# Patient Record
Sex: Female | Born: 1960
Health system: Southern US, Community
[De-identification: ages and names within clinical notes are randomized; demographics above are authoritative.]

## PROBLEM LIST (undated history)

## (undated) DIAGNOSIS — I10 Essential (primary) hypertension: Secondary | ICD-10-CM

## (undated) DIAGNOSIS — Z923 Personal history of irradiation: Secondary | ICD-10-CM

## (undated) DIAGNOSIS — G629 Polyneuropathy, unspecified: Secondary | ICD-10-CM

## (undated) DIAGNOSIS — Z9221 Personal history of antineoplastic chemotherapy: Secondary | ICD-10-CM

## (undated) DIAGNOSIS — C801 Malignant (primary) neoplasm, unspecified: Secondary | ICD-10-CM

## (undated) HISTORY — PX: KNEE SURGERY: SHX244

## (undated) HISTORY — PX: KNEE ARTHROSCOPY: SUR90

---

## 2009-11-04 ENCOUNTER — Emergency Department: Payer: Self-pay | Admitting: Emergency Medicine

## 2012-12-31 DIAGNOSIS — C801 Malignant (primary) neoplasm, unspecified: Secondary | ICD-10-CM

## 2012-12-31 HISTORY — DX: Malignant (primary) neoplasm, unspecified: C80.1

## 2016-02-20 ENCOUNTER — Emergency Department
Admission: EM | Admit: 2016-02-20 | Discharge: 2016-02-20 | Disposition: A | Discharge status: Home / Self Care | Payer: Self-pay | Attending: Emergency Medicine | Admitting: Emergency Medicine

## 2016-02-20 ENCOUNTER — Emergency Department: Payer: Self-pay

## 2016-02-20 ENCOUNTER — Encounter: Payer: Self-pay | Admitting: Emergency Medicine

## 2016-02-20 DIAGNOSIS — X501XXA Overexertion from prolonged static or awkward postures, initial encounter: Secondary | ICD-10-CM | POA: Insufficient documentation

## 2016-02-20 DIAGNOSIS — I10 Essential (primary) hypertension: Secondary | ICD-10-CM | POA: Insufficient documentation

## 2016-02-20 DIAGNOSIS — Y9289 Other specified places as the place of occurrence of the external cause: Secondary | ICD-10-CM | POA: Insufficient documentation

## 2016-02-20 DIAGNOSIS — Y998 Other external cause status: Secondary | ICD-10-CM | POA: Insufficient documentation

## 2016-02-20 DIAGNOSIS — S8991XA Unspecified injury of right lower leg, initial encounter: Secondary | ICD-10-CM | POA: Insufficient documentation

## 2016-02-20 DIAGNOSIS — Y9389 Activity, other specified: Secondary | ICD-10-CM | POA: Insufficient documentation

## 2016-02-20 HISTORY — DX: Essential (primary) hypertension: I10

## 2016-02-20 MED ORDER — HYDROCODONE-ACETAMINOPHEN 5-325 MG PO TABS: 1.0000 | ORAL_TABLET | ORAL | Status: DC | PRN

## 2016-02-20 MED ORDER — MELOXICAM 15 MG PO TABS: 15.0000 mg | ORAL_TABLET | Freq: Every day | ORAL | Status: DC

## 2016-02-20 MED ORDER — ONDANSETRON 4 MG PO TBDP: 4.0000 mg | ORAL_TABLET | Freq: Three times a day (TID) | ORAL | Status: DC | PRN

## 2016-02-20 MED ORDER — ONDANSETRON 8 MG PO TBDP
8.0000 mg | ORAL_TABLET | Freq: Once | ORAL | Status: AC
  Administered 2016-02-20: 8 mg via ORAL
  Filled 2016-02-20: qty 1

## 2016-02-20 MED ORDER — OXYCODONE-ACETAMINOPHEN 5-325 MG PO TABS
1.0000 | ORAL_TABLET | Freq: Once | ORAL | Status: AC
  Administered 2016-02-20: 1 via ORAL
  Filled 2016-02-20: qty 1

## 2016-02-20 NOTE — ED Notes (Signed)
Patient diagnosed and treated for lymphoma in 2014.  States she has a catheter in her head - an ommaya reservoir.  Visible right forehead.

## 2016-02-20 NOTE — ED Provider Notes (Signed)
[facility] Emergency Department Provider Note  ____________________________________________  Time seen: Approximately 11:55 AM  I have reviewed the triage vital signs and the nursing notes.   HISTORY  Chief Complaint Knee Pain    HPI Katelyn Harrison is a 55 y.o. female who presents to the emergency department via EMS complaining of right knee pain. Patient states that she was climbing up a ladder and straddling the back of a couch when the ladder gave way and she felt "twisting" her knee. Patient does have a history of torn meniscus to that knee. She has had bilateral knee surgeries. Patient is endorsing pain to both the lateral and the medial aspect of the right knee. Patient has not attempted to walk on her knee since injury.   Past Medical History  Diagnosis Date  . Hypertension     There are no active problems to display for this patient.   Past Surgical History  Procedure Laterality Date  . Knee arthroscopy      both knees    Current Outpatient Rx  Name  Route  Sig  Dispense  Refill  . HYDROcodone-acetaminophen (NORCO/VICODIN) 5-325 MG tablet   Oral   Take 1 tablet by mouth every 4 (four) hours as needed for moderate pain.   20 tablet   0   . meloxicam (MOBIC) 15 MG tablet   Oral   Take 1 tablet (15 mg total) by mouth daily.   30 tablet   0     Allergies Review of patient's allergies indicates not on file.  No family history on file.  Social History Social History  Substance Use Topics  . Smoking status: Never Smoker   . Smokeless tobacco: Not on file  . Alcohol Use: No     Review of Systems  Constitutional: No fever/chills Cardiovascular: no chest pain. Respiratory: no cough. No SOB. Musculoskeletal: Negative for back pain. Positive for right knee pain. Skin: Negative for rash. Neurological: Negative for headaches, focal weakness or numbness. 10-point ROS otherwise  negative.  ____________________________________________   PHYSICAL EXAM:  VITAL SIGNS: ED Triage Vitals  Enc Vitals Group     BP 02/20/16 1136 146/67 mmHg     Pulse Rate 02/20/16 1136 78     Resp 02/20/16 1136 16     Temp 02/20/16 1136 99.1 F (37.3 C)     Temp Source 02/20/16 1136 Oral     SpO2 02/20/16 1136 94 %     Weight 02/20/16 1136 230 lb (104.327 kg)     Height 02/20/16 1136 5\' 5"  (1.651 m)     Head Cir --      Peak Flow --      Pain Score 02/20/16 1137 8     Pain Loc --      Pain Edu? --      Excl. in [place]? --      Constitutional: Alert and oriented. Well appearing and in no acute distress. Eyes: Conjunctivae are normal. PERRL. EOMI. Head: Atraumatic. Cardiovascular: Normal rate, regular rhythm. Normal S1 and S2.  Good peripheral circulation. Respiratory: Normal respiratory effort without tachypnea or retractions. Lungs CTAB. Musculoskeletal: No visible deformity to right knee with comparison left. Patient has limited to no range of motion in right knee due to pain. Patient is diffusely tender palpation over both the lateral and medial aspect of the. No point tenderness. No palpable abnormality. Patient does have positive ballottement to the anterior aspect of the knee. Varus, valgus, Lachman's are negative. Neurologic:  Normal speech and language. No gross focal neurologic deficits are appreciated.  Skin:  Skin is warm, dry and intact. No rash noted. Psychiatric: Mood and affect are normal. Speech and behavior are normal. Patient exhibits appropriate insight and judgement.   ____________________________________________   LABS (all labs ordered are listed, but only abnormal results are displayed)  Labs Reviewed - No data to display ____________________________________________  EKG   ____________________________________________  RADIOLOGY Diamantina Providence Cuthriell, personally viewed and evaluated these images (plain radiographs) as part of my medical decision  making, as well as reviewing the written report by the radiologist.  Dg Knee Complete 4 Views Right  02/20/2016  CLINICAL DATA:  Patient states she was up a ladder - 3 rungs up cleaning the air vent in the ceiling. States she went to step onto Back of sofa and ladder went out from under her. Pt. Complaining of right knee pain "I think it was twisted". History of torn menisucus EXAM: RIGHT KNEE - COMPLETE 4+ VIEW COMPARISON:  None. FINDINGS: Marginal spurs about all 3 compartments of the knee most marked laterally. Normal alignment. No fracture. No effusion. Normal mineralization. Regional soft tissues unremarkable. IMPRESSION: 1. Negative for fracture or other acute bone abnormality. 2. Tricompartmental degenerative spurring, most marked laterally. Electronically Signed   By: Lucrezia Europe M.D.   On: 02/20/2016 13:12    ____________________________________________    PROCEDURES  Procedure(s) performed:       Medications  ondansetron (ZOFRAN-ODT) disintegrating tablet 8 mg (8 mg Oral Given 02/20/16 1209)  oxyCODONE-acetaminophen (PERCOCET/ROXICET) 5-325 MG per tablet 1 tablet (1 tablet Oral Given 02/20/16 1209)     ____________________________________________   INITIAL IMPRESSION / ASSESSMENT AND PLAN / ED COURSE  Pertinent labs & imaging results that were available during my care of the patient were reviewed by me and considered in my medical decision making (see chart for details).  Patient's diagnosis is consistent with right knee pain. Patient's x-ray was unremarkable for acute abnormality. There is minor ballottement noted to the anterior of the knee consistent with effusion. Patient will be discharged home with prescriptions for pain medication and anti-inflammatories. Patient is to follow up with orthopedics if symptoms persist past this treatment course. Patient is given ED precautions to return to the ED for any worsening or new  symptoms.     ____________________________________________  FINAL CLINICAL IMPRESSION(S) / ED DIAGNOSES  Final diagnoses:  Right knee injury, initial encounter      NEW MEDICATIONS STARTED DURING THIS VISIT:  New Prescriptions   HYDROCODONE-ACETAMINOPHEN (NORCO/VICODIN) 5-325 MG TABLET    Take 1 tablet by mouth every 4 (four) hours as needed for moderate pain.   MELOXICAM (MOBIC) 15 MG TABLET    Take 1 tablet (15 mg total) by mouth daily.        Charline Bills Cuthriell, PA-C 02/20/16 1342  Gregor Hams, MD 02/21/16 [phone]

## 2016-02-20 NOTE — ED Notes (Signed)
AAOx3.  Skin warm and dry.  NAD 

## 2016-02-20 NOTE — Discharge Instructions (Signed)

## 2016-02-20 NOTE — ED Notes (Signed)
Patient states she was up a ladder - 3 rungs up cleaning the air vent in the ceiling.  States she went to step onto  Back of sofa and ladder went out from under her.  Pt. Complaining of right knee pain "I think it was twisted".  History of torn menisucus.  Has had bilat. Knee surgery in the past.  No obvious deformity.

## 2016-04-18 DIAGNOSIS — M1711 Unilateral primary osteoarthritis, right knee: Secondary | ICD-10-CM | POA: Diagnosis not present

## 2016-06-12 DIAGNOSIS — C8350 Lymphoblastic (diffuse) lymphoma, unspecified site: Secondary | ICD-10-CM | POA: Diagnosis not present

## 2016-06-12 DIAGNOSIS — C8358 Lymphoblastic (diffuse) lymphoma, lymph nodes of multiple sites: Secondary | ICD-10-CM | POA: Diagnosis not present

## 2016-12-11 DIAGNOSIS — C8359 Lymphoblastic (diffuse) lymphoma, extranodal and solid organ sites: Secondary | ICD-10-CM | POA: Diagnosis not present

## 2016-12-11 DIAGNOSIS — M25561 Pain in right knee: Secondary | ICD-10-CM | POA: Diagnosis not present

## 2016-12-11 DIAGNOSIS — C8350 Lymphoblastic (diffuse) lymphoma, unspecified site: Secondary | ICD-10-CM | POA: Diagnosis not present

## 2016-12-11 DIAGNOSIS — G8929 Other chronic pain: Secondary | ICD-10-CM | POA: Diagnosis not present

## 2016-12-11 DIAGNOSIS — C8358 Lymphoblastic (diffuse) lymphoma, lymph nodes of multiple sites: Secondary | ICD-10-CM | POA: Diagnosis not present

## 2017-01-02 DIAGNOSIS — M25561 Pain in right knee: Secondary | ICD-10-CM | POA: Diagnosis not present

## 2017-01-02 DIAGNOSIS — M25461 Effusion, right knee: Secondary | ICD-10-CM | POA: Diagnosis not present

## 2017-01-02 DIAGNOSIS — M1711 Unilateral primary osteoarthritis, right knee: Secondary | ICD-10-CM | POA: Diagnosis not present

## 2017-01-02 DIAGNOSIS — M5136 Other intervertebral disc degeneration, lumbar region: Secondary | ICD-10-CM | POA: Diagnosis not present

## 2017-01-08 DIAGNOSIS — R200 Anesthesia of skin: Secondary | ICD-10-CM | POA: Diagnosis not present

## 2017-01-08 DIAGNOSIS — C8358 Lymphoblastic (diffuse) lymphoma, lymph nodes of multiple sites: Secondary | ICD-10-CM | POA: Diagnosis not present

## 2017-01-08 DIAGNOSIS — C8350 Lymphoblastic (diffuse) lymphoma, unspecified site: Secondary | ICD-10-CM | POA: Diagnosis not present

## 2017-01-08 DIAGNOSIS — R202 Paresthesia of skin: Secondary | ICD-10-CM | POA: Diagnosis not present

## 2017-01-08 DIAGNOSIS — Z23 Encounter for immunization: Secondary | ICD-10-CM | POA: Diagnosis not present

## 2017-02-01 DIAGNOSIS — M25561 Pain in right knee: Secondary | ICD-10-CM | POA: Diagnosis not present

## 2017-02-01 DIAGNOSIS — M4807 Spinal stenosis, lumbosacral region: Secondary | ICD-10-CM | POA: Diagnosis not present

## 2017-02-04 DIAGNOSIS — M5416 Radiculopathy, lumbar region: Secondary | ICD-10-CM | POA: Diagnosis not present

## 2017-02-04 DIAGNOSIS — M1711 Unilateral primary osteoarthritis, right knee: Secondary | ICD-10-CM | POA: Diagnosis not present

## 2017-02-04 DIAGNOSIS — C8350 Lymphoblastic (diffuse) lymphoma, unspecified site: Secondary | ICD-10-CM | POA: Diagnosis not present

## 2017-02-04 DIAGNOSIS — Z1231 Encounter for screening mammogram for malignant neoplasm of breast: Secondary | ICD-10-CM | POA: Diagnosis not present

## 2017-02-04 DIAGNOSIS — Z1159 Encounter for screening for other viral diseases: Secondary | ICD-10-CM | POA: Diagnosis not present

## 2017-02-04 DIAGNOSIS — I10 Essential (primary) hypertension: Secondary | ICD-10-CM | POA: Diagnosis not present

## 2017-02-04 DIAGNOSIS — Z1211 Encounter for screening for malignant neoplasm of colon: Secondary | ICD-10-CM | POA: Diagnosis not present

## 2017-02-04 DIAGNOSIS — Z114 Encounter for screening for human immunodeficiency virus [HIV]: Secondary | ICD-10-CM | POA: Diagnosis not present

## 2017-02-05 DIAGNOSIS — C8350 Lymphoblastic (diffuse) lymphoma, unspecified site: Secondary | ICD-10-CM | POA: Diagnosis not present

## 2017-02-05 DIAGNOSIS — M4807 Spinal stenosis, lumbosacral region: Secondary | ICD-10-CM | POA: Diagnosis not present

## 2017-02-05 DIAGNOSIS — M170 Bilateral primary osteoarthritis of knee: Secondary | ICD-10-CM | POA: Diagnosis not present

## 2017-02-05 DIAGNOSIS — M5136 Other intervertebral disc degeneration, lumbar region: Secondary | ICD-10-CM | POA: Diagnosis not present

## 2017-02-05 DIAGNOSIS — M25461 Effusion, right knee: Secondary | ICD-10-CM | POA: Diagnosis not present

## 2017-02-05 DIAGNOSIS — Z9221 Personal history of antineoplastic chemotherapy: Secondary | ICD-10-CM | POA: Diagnosis not present

## 2017-02-05 DIAGNOSIS — M545 Low back pain: Secondary | ICD-10-CM | POA: Diagnosis not present

## 2017-02-05 DIAGNOSIS — Z8041 Family history of malignant neoplasm of ovary: Secondary | ICD-10-CM | POA: Diagnosis not present

## 2017-02-14 ENCOUNTER — Other Ambulatory Visit: Payer: Self-pay | Admitting: Family Medicine

## 2017-02-14 DIAGNOSIS — Z1231 Encounter for screening mammogram for malignant neoplasm of breast: Secondary | ICD-10-CM

## 2017-02-21 ENCOUNTER — Ambulatory Visit
Admission: RE | Admit: 2017-02-21 | Discharge: 2017-02-21 | Disposition: A | Discharge status: Home / Self Care | Payer: Medicare Other | Source: Ambulatory Visit | Attending: Family Medicine | Admitting: Family Medicine

## 2017-02-21 DIAGNOSIS — Z1231 Encounter for screening mammogram for malignant neoplasm of breast: Secondary | ICD-10-CM | POA: Diagnosis not present

## 2017-02-21 HISTORY — DX: Personal history of irradiation: Z92.3

## 2017-02-21 HISTORY — DX: Personal history of antineoplastic chemotherapy: Z92.21

## 2017-02-21 HISTORY — DX: Malignant (primary) neoplasm, unspecified: C80.1

## 2017-02-27 ENCOUNTER — Inpatient Hospital Stay
Admission: RE | Admit: 2017-02-27 | Discharge: 2017-02-27 | Disposition: A | Discharge status: Home / Self Care | Payer: Self-pay | Source: Ambulatory Visit | Attending: *Deleted | Admitting: *Deleted

## 2017-02-27 ENCOUNTER — Other Ambulatory Visit: Payer: Self-pay | Admitting: *Deleted

## 2017-02-27 DIAGNOSIS — Z9289 Personal history of other medical treatment: Secondary | ICD-10-CM

## 2017-03-04 DIAGNOSIS — I10 Essential (primary) hypertension: Secondary | ICD-10-CM | POA: Diagnosis not present

## 2017-03-04 DIAGNOSIS — Z114 Encounter for screening for human immunodeficiency virus [HIV]: Secondary | ICD-10-CM | POA: Diagnosis not present

## 2017-03-04 DIAGNOSIS — Z1159 Encounter for screening for other viral diseases: Secondary | ICD-10-CM | POA: Diagnosis not present

## 2017-03-18 DIAGNOSIS — Z124 Encounter for screening for malignant neoplasm of cervix: Secondary | ICD-10-CM | POA: Diagnosis not present

## 2017-03-18 DIAGNOSIS — Z1211 Encounter for screening for malignant neoplasm of colon: Secondary | ICD-10-CM | POA: Diagnosis not present

## 2017-03-18 DIAGNOSIS — G620 Drug-induced polyneuropathy: Secondary | ICD-10-CM | POA: Diagnosis not present

## 2017-03-18 DIAGNOSIS — Z8579 Personal history of other malignant neoplasms of lymphoid, hematopoietic and related tissues: Secondary | ICD-10-CM | POA: Diagnosis not present

## 2017-03-18 DIAGNOSIS — I10 Essential (primary) hypertension: Secondary | ICD-10-CM | POA: Diagnosis not present

## 2017-03-18 DIAGNOSIS — Z1151 Encounter for screening for human papillomavirus (HPV): Secondary | ICD-10-CM | POA: Diagnosis not present

## 2017-04-18 DIAGNOSIS — G620 Drug-induced polyneuropathy: Secondary | ICD-10-CM | POA: Diagnosis not present

## 2017-04-18 DIAGNOSIS — G5603 Carpal tunnel syndrome, bilateral upper limbs: Secondary | ICD-10-CM | POA: Diagnosis not present

## 2017-05-09 DIAGNOSIS — G5603 Carpal tunnel syndrome, bilateral upper limbs: Secondary | ICD-10-CM | POA: Diagnosis not present

## 2017-05-09 DIAGNOSIS — G620 Drug-induced polyneuropathy: Secondary | ICD-10-CM | POA: Diagnosis not present

## 2017-05-21 DIAGNOSIS — G8929 Other chronic pain: Secondary | ICD-10-CM | POA: Diagnosis not present

## 2017-05-21 DIAGNOSIS — M25561 Pain in right knee: Secondary | ICD-10-CM | POA: Diagnosis not present

## 2017-05-21 DIAGNOSIS — M545 Low back pain: Secondary | ICD-10-CM | POA: Diagnosis not present

## 2017-05-29 DIAGNOSIS — M545 Low back pain: Secondary | ICD-10-CM | POA: Diagnosis not present

## 2017-06-10 DIAGNOSIS — M545 Low back pain: Secondary | ICD-10-CM | POA: Diagnosis not present

## 2017-06-13 DIAGNOSIS — M545 Low back pain: Secondary | ICD-10-CM | POA: Diagnosis not present

## 2017-06-24 DIAGNOSIS — M545 Low back pain: Secondary | ICD-10-CM | POA: Diagnosis not present

## 2017-06-26 DIAGNOSIS — M545 Low back pain: Secondary | ICD-10-CM | POA: Diagnosis not present

## 2017-07-04 ENCOUNTER — Encounter: Payer: Self-pay | Admitting: *Deleted

## 2017-07-04 ENCOUNTER — Ambulatory Visit
Admission: EM | Admit: 2017-07-04 | Discharge: 2017-07-04 | Disposition: A | Discharge status: Home / Self Care | Payer: Medicare Other | Attending: Family Medicine | Admitting: Family Medicine

## 2017-07-04 DIAGNOSIS — R3129 Other microscopic hematuria: Secondary | ICD-10-CM

## 2017-07-04 DIAGNOSIS — Z91013 Allergy to seafood: Secondary | ICD-10-CM | POA: Diagnosis not present

## 2017-07-04 DIAGNOSIS — Z923 Personal history of irradiation: Secondary | ICD-10-CM | POA: Insufficient documentation

## 2017-07-04 DIAGNOSIS — Z79899 Other long term (current) drug therapy: Secondary | ICD-10-CM | POA: Insufficient documentation

## 2017-07-04 DIAGNOSIS — R350 Frequency of micturition: Secondary | ICD-10-CM | POA: Diagnosis not present

## 2017-07-04 DIAGNOSIS — I10 Essential (primary) hypertension: Secondary | ICD-10-CM | POA: Diagnosis not present

## 2017-07-04 DIAGNOSIS — Z9221 Personal history of antineoplastic chemotherapy: Secondary | ICD-10-CM | POA: Insufficient documentation

## 2017-07-04 DIAGNOSIS — Z888 Allergy status to other drugs, medicaments and biological substances status: Secondary | ICD-10-CM | POA: Insufficient documentation

## 2017-07-04 DIAGNOSIS — Z8572 Personal history of non-Hodgkin lymphomas: Secondary | ICD-10-CM | POA: Diagnosis not present

## 2017-07-04 DIAGNOSIS — R109 Unspecified abdominal pain: Secondary | ICD-10-CM | POA: Insufficient documentation

## 2017-07-04 LAB — URINALYSIS, COMPLETE (UACMP) WITH MICROSCOPIC
BILIRUBIN URINE: NEGATIVE
Glucose, UA: NEGATIVE mg/dL
KETONES UR: NEGATIVE mg/dL
LEUKOCYTES UA: NEGATIVE
NITRITE: NEGATIVE
PH: 7.5 (ref 5.0–8.0)
PROTEIN: 30 mg/dL — AB
Specific Gravity, Urine: 1.02 (ref 1.005–1.030)

## 2017-07-04 MED ORDER — HYDROCODONE-ACETAMINOPHEN 5-325 MG PO TABS: ORAL_TABLET | ORAL | 0 refills | Status: AC

## 2017-07-04 MED ORDER — ONDANSETRON 8 MG PO TBDP: 8.0000 mg | ORAL_TABLET | Freq: Three times a day (TID) | ORAL | 0 refills | Status: DC | PRN

## 2017-07-04 MED ORDER — ONDANSETRON 8 MG PO TBDP
8.0000 mg | ORAL_TABLET | Freq: Once | ORAL | Status: AC
  Administered 2017-07-04: 8 mg via ORAL

## 2017-07-04 MED ORDER — TAMSULOSIN HCL 0.4 MG PO CAPS: 0.4000 mg | ORAL_CAPSULE | Freq: Every day | ORAL | 0 refills | Status: DC

## 2017-07-04 NOTE — ED Provider Notes (Signed)
[facility]    CSN: [id number] Arrival date & time: 07/04/17  1550     History   Chief Complaint Chief Complaint  Patient presents with  . Abdominal Pain    HPI Katelyn Harrison is a 56 y.o. female.   56 yo female with a c/o left flank pain, groin pain and frequent urination since noon today. Symptoms associated with nausea, however denies vomiting, fevers, chills, diarrhea, gross hematuria, injury.     The history is provided by the patient.    Past Medical History:  Diagnosis Date  . Cancer ([place]) 2014   lymphoma- had chemo and rad  . Hypertension   . Personal history of chemotherapy   . Personal history of radiation therapy     There are no active problems to display for this patient.   Past Surgical History:  Procedure Laterality Date  . KNEE ARTHROSCOPY     both knees    OB History    No data available       Home Medications    Prior to Admission medications   Medication Sig Start Date End Date Taking? Authorizing Provider  amLODipine (NORVASC) 5 MG tablet Take 5 mg by mouth daily.   Yes [provider]  celecoxib (CELEBREX) 200 MG capsule Take 200 mg by mouth 2 (two) times daily.   Yes [provider]  meloxicam (MOBIC) 15 MG tablet Take 1 tablet (15 mg total) by mouth daily. 02/20/16  Yes Cuthriell, Charline Bills, PA-C  topiramate (TOPAMAX) 25 MG capsule Take 50 mg by mouth 2 (two) times daily.   Yes [provider]  HYDROcodone-acetaminophen (NORCO/VICODIN) 5-325 MG tablet 1-2 tabs po q 8 hours prn 07/04/17   Norval Gable, MD  ondansetron (ZOFRAN ODT) 8 MG disintegrating tablet Take 1 tablet (8 mg total) by mouth every 8 (eight) hours as needed. 07/04/17   Norval Gable, MD  tamsulosin (FLOMAX) 0.4 MG CAPS capsule Take 1 capsule (0.4 mg total) by mouth daily. 07/04/17   Norval Gable, MD    Family History Family History  Problem Relation Age of Onset  . Breast cancer Neg Hx     Social History Social History    Substance Use Topics  . Smoking status: Never Smoker  . Smokeless tobacco: Never Used  . Alcohol use No     Allergies   Shellfish allergy and Compazine [prochlorperazine edisylate]   Review of Systems Review of Systems   Physical Exam Triage Vital Signs ED Triage Vitals  Enc Vitals Group     BP 07/04/17 1659 126/64     Pulse Rate 07/04/17 1659 (!) 54     Resp 07/04/17 1659 18     Temp 07/04/17 1659 97.9 F (36.6 C)     Temp Source 07/04/17 1659 Oral     SpO2 07/04/17 1659 99 %     Weight 07/04/17 1700 240 lb (108.9 kg)     Height 07/04/17 1700 5\' 5"  (1.651 m)     Head Circumference --      Peak Flow --      Pain Score 07/04/17 1700 10     Pain Loc --      Pain Edu? --      Excl. in [place]? --    No data found.   Updated Vital Signs BP 126/64 (BP Location: Left Arm)   Pulse (!) 54   Temp 97.9 F (36.6 C) (Oral)   Resp 18   Ht 5\' 5"  (1.651  m)   Wt 240 lb (108.9 kg)   SpO2 99%   BMI 39.94 kg/m   Visual Acuity Right Eye Distance:   Left Eye Distance:   Bilateral Distance:    Right Eye Near:   Left Eye Near:    Bilateral Near:     Physical Exam  Constitutional: She appears well-developed and well-nourished. No distress.  Abdominal: Soft. Bowel sounds are normal. She exhibits no distension and no mass. There is tenderness (left flank and groin area, mild; no rebound or guarding). There is no rebound and no guarding.  Skin: She is not diaphoretic.  Nursing note and vitals reviewed.    UC Treatments / Results  Labs (all labs ordered are listed, but only abnormal results are displayed) Labs Reviewed  URINALYSIS, COMPLETE (UACMP) WITH MICROSCOPIC - Abnormal; Notable for the following:       Result Value   APPearance CLOUDY (*)    Hgb urine dipstick MODERATE (*)    Protein, ur 30 (*)    Squamous Epithelial / LPF 0-5 (*)    Bacteria, UA FEW (*)    All other components within normal limits  URINE CULTURE    EKG  EKG Interpretation None        Radiology No results found.  Procedures Procedures (including critical care time)  Medications Ordered in UC Medications  ondansetron (ZOFRAN-ODT) disintegrating tablet 8 mg (8 mg Oral Given 07/04/17 1737)     Initial Impression / Assessment and Plan / UC Course  I have reviewed the triage vital signs and the nursing notes.  Pertinent labs & imaging results that were available during my care of the patient were reviewed by me and considered in my medical decision making (see chart for details).       Final Clinical Impressions(s) / UC Diagnoses   Final diagnoses:  Acute left flank pain  Other microscopic hematuria  Urinary frequency  (likely secondary to nephrolithiasis)  New Prescriptions Discharge Medication List as of 07/04/2017  5:35 PM    START taking these medications   Details  tamsulosin (FLOMAX) 0.4 MG CAPS capsule Take 1 capsule (0.4 mg total) by mouth daily., Starting Thu 07/04/2017, Normal       1. Lab results and diagnosis reviewed with patient 2. rx as per orders above; reviewed possible side effects, interactions, risks and benefits  3. Recommend supportive treatment with increased water intake 4. Close monitoring and follow-up prn if symptoms worsen or don't improve   Norval Gable, MD 07/04/17 2014

## 2017-07-04 NOTE — ED Triage Notes (Signed)
Patient started having LLQ pain today at 1200 hrs. Patient has no previous history of renal issues.

## 2017-07-06 LAB — URINE CULTURE: SPECIAL REQUESTS: NORMAL

## 2017-07-16 DIAGNOSIS — C8350 Lymphoblastic (diffuse) lymphoma, unspecified site: Secondary | ICD-10-CM | POA: Diagnosis not present

## 2017-07-16 DIAGNOSIS — C8358 Lymphoblastic (diffuse) lymphoma, lymph nodes of multiple sites: Secondary | ICD-10-CM | POA: Diagnosis not present

## 2017-07-18 DIAGNOSIS — M545 Low back pain: Secondary | ICD-10-CM | POA: Diagnosis not present

## 2017-09-17 DIAGNOSIS — Z87442 Personal history of urinary calculi: Secondary | ICD-10-CM | POA: Diagnosis not present

## 2017-09-17 DIAGNOSIS — I10 Essential (primary) hypertension: Secondary | ICD-10-CM | POA: Diagnosis not present

## 2017-09-17 DIAGNOSIS — R809 Proteinuria, unspecified: Secondary | ICD-10-CM | POA: Diagnosis not present

## 2017-09-17 DIAGNOSIS — Z1211 Encounter for screening for malignant neoplasm of colon: Secondary | ICD-10-CM | POA: Diagnosis not present

## 2017-09-17 DIAGNOSIS — M792 Neuralgia and neuritis, unspecified: Secondary | ICD-10-CM | POA: Diagnosis not present

## 2017-09-17 DIAGNOSIS — Z23 Encounter for immunization: Secondary | ICD-10-CM | POA: Diagnosis not present

## 2017-09-17 DIAGNOSIS — G8929 Other chronic pain: Secondary | ICD-10-CM | POA: Diagnosis not present

## 2017-09-17 DIAGNOSIS — C8358 Lymphoblastic (diffuse) lymphoma, lymph nodes of multiple sites: Secondary | ICD-10-CM | POA: Diagnosis not present

## 2017-09-30 DIAGNOSIS — R3129 Other microscopic hematuria: Secondary | ICD-10-CM | POA: Diagnosis not present

## 2017-10-03 ENCOUNTER — Ambulatory Visit: Payer: Medicare Other

## 2017-10-03 ENCOUNTER — Ambulatory Visit
Admission: EM | Admit: 2017-10-03 | Discharge: 2017-10-03 | Disposition: A | Discharge status: Home / Self Care | Payer: Medicare Other | Attending: Family Medicine | Admitting: Family Medicine

## 2017-10-03 DIAGNOSIS — Z79899 Other long term (current) drug therapy: Secondary | ICD-10-CM | POA: Diagnosis not present

## 2017-10-03 DIAGNOSIS — Z791 Long term (current) use of non-steroidal anti-inflammatories (NSAID): Secondary | ICD-10-CM | POA: Insufficient documentation

## 2017-10-03 DIAGNOSIS — Z9889 Other specified postprocedural states: Secondary | ICD-10-CM | POA: Insufficient documentation

## 2017-10-03 DIAGNOSIS — M1830 Unilateral post-traumatic osteoarthritis of first carpometacarpal joint, unspecified hand: Secondary | ICD-10-CM

## 2017-10-03 DIAGNOSIS — Z96653 Presence of artificial knee joint, bilateral: Secondary | ICD-10-CM | POA: Insufficient documentation

## 2017-10-03 DIAGNOSIS — Z888 Allergy status to other drugs, medicaments and biological substances status: Secondary | ICD-10-CM | POA: Insufficient documentation

## 2017-10-03 DIAGNOSIS — Z9221 Personal history of antineoplastic chemotherapy: Secondary | ICD-10-CM | POA: Insufficient documentation

## 2017-10-03 DIAGNOSIS — Z91013 Allergy to seafood: Secondary | ICD-10-CM | POA: Insufficient documentation

## 2017-10-03 DIAGNOSIS — Z859 Personal history of malignant neoplasm, unspecified: Secondary | ICD-10-CM | POA: Insufficient documentation

## 2017-10-03 DIAGNOSIS — M1832 Unilateral post-traumatic osteoarthritis of first carpometacarpal joint, left hand: Secondary | ICD-10-CM | POA: Insufficient documentation

## 2017-10-03 DIAGNOSIS — I10 Essential (primary) hypertension: Secondary | ICD-10-CM | POA: Insufficient documentation

## 2017-10-03 DIAGNOSIS — Z923 Personal history of irradiation: Secondary | ICD-10-CM | POA: Diagnosis not present

## 2017-10-03 DIAGNOSIS — S6992XA Unspecified injury of left wrist, hand and finger(s), initial encounter: Secondary | ICD-10-CM | POA: Diagnosis not present

## 2017-10-03 DIAGNOSIS — G629 Polyneuropathy, unspecified: Secondary | ICD-10-CM | POA: Diagnosis not present

## 2017-10-03 HISTORY — DX: Polyneuropathy, unspecified: G62.9

## 2017-10-03 NOTE — ED Provider Notes (Signed)
[facility]    CSN: [id number] Arrival date & time: 10/03/17  0944     History   Chief Complaint Chief Complaint  Patient presents with  . Hand Injury    HPI Katelyn Harrison is a 56 y.o. female.   HPI  This a 56 year old female who is right-hand dominant presents with an injury to her left thumb that happened yesterday. Her  25-year-old grandson was upset and was running around when she tried to grab him injured the base of her left thumb. She is not know the exact mechanism. Pain is sharply localized over the first CMP joint. There is no other injury to her hand.        Past Medical History:  Diagnosis Date  . Cancer ([place]) 2014   lymphoma- had chemo and rad  . Hypertension   . Neuropathy   . Personal history of chemotherapy   . Personal history of radiation therapy     There are no active problems to display for this patient.   Past Surgical History:  Procedure Laterality Date  . KNEE ARTHROSCOPY     both knees    OB History    No data available       Home Medications    Prior to Admission medications   Medication Sig Start Date End Date Taking? Authorizing Provider  gabapentin (NEURONTIN) 300 MG capsule Take 300 mg by mouth at bedtime.   Yes [provider]  amLODipine (NORVASC) 5 MG tablet Take 5 mg by mouth daily.    [provider]  celecoxib (CELEBREX) 200 MG capsule Take 200 mg by mouth 2 (two) times daily.    [provider]  HYDROcodone-acetaminophen (NORCO/VICODIN) 5-325 MG tablet 1-2 tabs po q 8 hours prn 07/04/17   Norval Gable, MD  meloxicam (MOBIC) 15 MG tablet Take 1 tablet (15 mg total) by mouth daily. 02/20/16   Cuthriell, Charline Bills, PA-C  ondansetron (ZOFRAN ODT) 8 MG disintegrating tablet Take 1 tablet (8 mg total) by mouth every 8 (eight) hours as needed. 07/04/17   Norval Gable, MD  topiramate (TOPAMAX) 25 MG capsule Take 50 mg by mouth 2 (two) times daily.    [provider]     Family History Family History  Problem Relation Age of Onset  . Breast cancer Neg Hx     Social History Social History  Substance Use Topics  . Smoking status: Never Smoker  . Smokeless tobacco: Never Used  . Alcohol use No     Allergies   Shellfish allergy and Compazine [prochlorperazine edisylate]   Review of Systems Review of Systems  Constitutional: Positive for activity change. Negative for appetite change, fatigue and fever.  Musculoskeletal: Positive for arthralgias and joint swelling.  All other systems reviewed and are negative.    Physical Exam Triage Vital Signs ED Triage Vitals  Enc Vitals Group     BP 10/03/17 0956 (!) 146/65     Pulse Rate 10/03/17 0956 60     Resp 10/03/17 0956 18     Temp 10/03/17 0956 97.7 F (36.5 C)     Temp src --      SpO2 10/03/17 0956 98 %     Weight 10/03/17 0956 245 lb (111.1 kg)     Height 10/03/17 0956 5\' 5"  (1.651 m)     Head Circumference --      Peak Flow --      Pain Score 10/03/17 0959 6  Pain Loc --      Pain Edu? --      Excl. in [place]? --    No data found.   Updated Vital Signs BP (!) 146/65 (BP Location: Left Arm)   Pulse 60   Temp 97.7 F (36.5 C)   Resp 18   Ht 5\' 5"  (1.651 m)   Wt 245 lb (111.1 kg)   SpO2 98%   BMI 40.77 kg/m   Visual Acuity Right Eye Distance:   Left Eye Distance:   Bilateral Distance:    Right Eye Near:   Left Eye Near:    Bilateral Near:     Physical Exam  Constitutional: She is oriented to person, place, and time. She appears well-developed and well-nourished. No distress.  HENT:  Head: Normocephalic.  Eyes: Pupils are equal, round, and reactive to light.  Neck: Normal range of motion.  Musculoskeletal: She exhibits edema and tenderness.  Examination of the nondominant left hand shows swelling at the first CMP joint. There is no tenderness distal. IP joint of the thumb is normal shows good range of motion. Maximum tenderness is sharply localized over the  first CMP joint. Is a mildly positive grind test. There is no ligamentous instability noticed. She has no other findings in the left hand of significance.  Neurological: She is alert and oriented to person, place, and time.  Skin: Skin is warm and dry. She is not diaphoretic.  Psychiatric: She has a normal mood and affect. Her behavior is normal. Judgment and thought content normal.  Nursing note and vitals reviewed.    UC Treatments / Results  Labs (all labs ordered are listed, but only abnormal results are displayed) Labs Reviewed - No data to display  EKG  EKG Interpretation None       Radiology Dg Finger Thumb Left  Result Date: 10/03/2017 CLINICAL DATA:  Traumatic thumb injury with pain near the carpometacarpal articulation. EXAM: LEFT THUMB 2+V COMPARISON:  None. FINDINGS: Prominent and irregular spurring and loss of articular space at the first [facility] joint compatible with degenerative arthropathy. No discrete fracture or foreign body identified. Mild degenerative spurring at the MCP joint and interphalangeal joint. IMPRESSION: 1. Prominent degenerative arthropathy at the first carpometacarpal articulation. Mild degenerative arthropathy at the thumb MCP joint and interphalangeal joint. No appreciable fracture. Electronically Signed   By: Van Clines M.D.   On: 10/03/2017 10:27    Procedures Procedures (including critical care time)  Medications Ordered in UC Medications - No data to display   Initial Impression / Assessment and Plan / UC Course  I have reviewed the triage vital signs and the nursing notes.  Pertinent labs & imaging results that were available during my care of the patient were reviewed by me and considered in my medical decision making (see chart for details).     Plan: 1. Test/x-ray results and diagnosis reviewed with patient 2. rx as per orders; risks, benefits, potential side effects reviewed with patient 3. Recommend supportive treatment with  ice 20 minutes out of every 2 hours 4-5 times daily. Elevate as necessary for comfort. Use Celebrex that she already has at home help with pain. Use the splint full-time for 1-2 weeks and then begin weaning as tolerated. May remove the splint for personal care. If you're not improving I recommend you follow-up with [facility]. 4. F/u prn if symptoms worsen or don't improve   Final Clinical Impressions(s) / UC Diagnoses   Final diagnoses:  Post-traumatic osteoarthritis  of first carpometacarpal [facility]) joint, unspecified laterality    New Prescriptions Discharge Medication List as of 10/03/2017 10:48 AM       Controlled Substance Prescriptions [place] Controlled Substance Registry consulted? Not Applicable   Lorin Picket, PA-C 10/03/17 1100

## 2017-10-03 NOTE — ED Triage Notes (Signed)
Pt reports her grandson ran to her yesterday and did something to her left thumb in the process. Pain at base of thumb. Has full ROM but pain 6/10

## 2017-10-03 NOTE — Discharge Instructions (Signed)
Use ice 20 minutes out of every 2 hours to 5 times daily. Wear splint full-time 1-2 weeks and then begin weaning as tolerated. If continuing to have pain and do not seem to be improving recommend following up with [facility]. May remove splint for personal care. Recommend sleeping and wearing the splint full-time for 1-2 weeks.

## 2017-10-03 NOTE — ED Notes (Signed)
Thumb spica placed to left hand with good PMS post application

## 2017-10-11 DIAGNOSIS — R319 Hematuria, unspecified: Secondary | ICD-10-CM | POA: Diagnosis not present

## 2017-10-11 DIAGNOSIS — R3129 Other microscopic hematuria: Secondary | ICD-10-CM | POA: Diagnosis not present

## 2017-10-15 IMAGING — MG MM DIGITAL SCREENING BILAT W/ CAD
4 series · 4 of 4 positions shown · non-contrast
Comparison: Previous exam(s).

ACR Breast Density Category a: The breast tissue is almost entirely
fatty.

CLINICAL DATA: Screening.

EXAM:
DIGITAL SCREENING BILATERAL MAMMOGRAM WITH CAD

[R MLO]
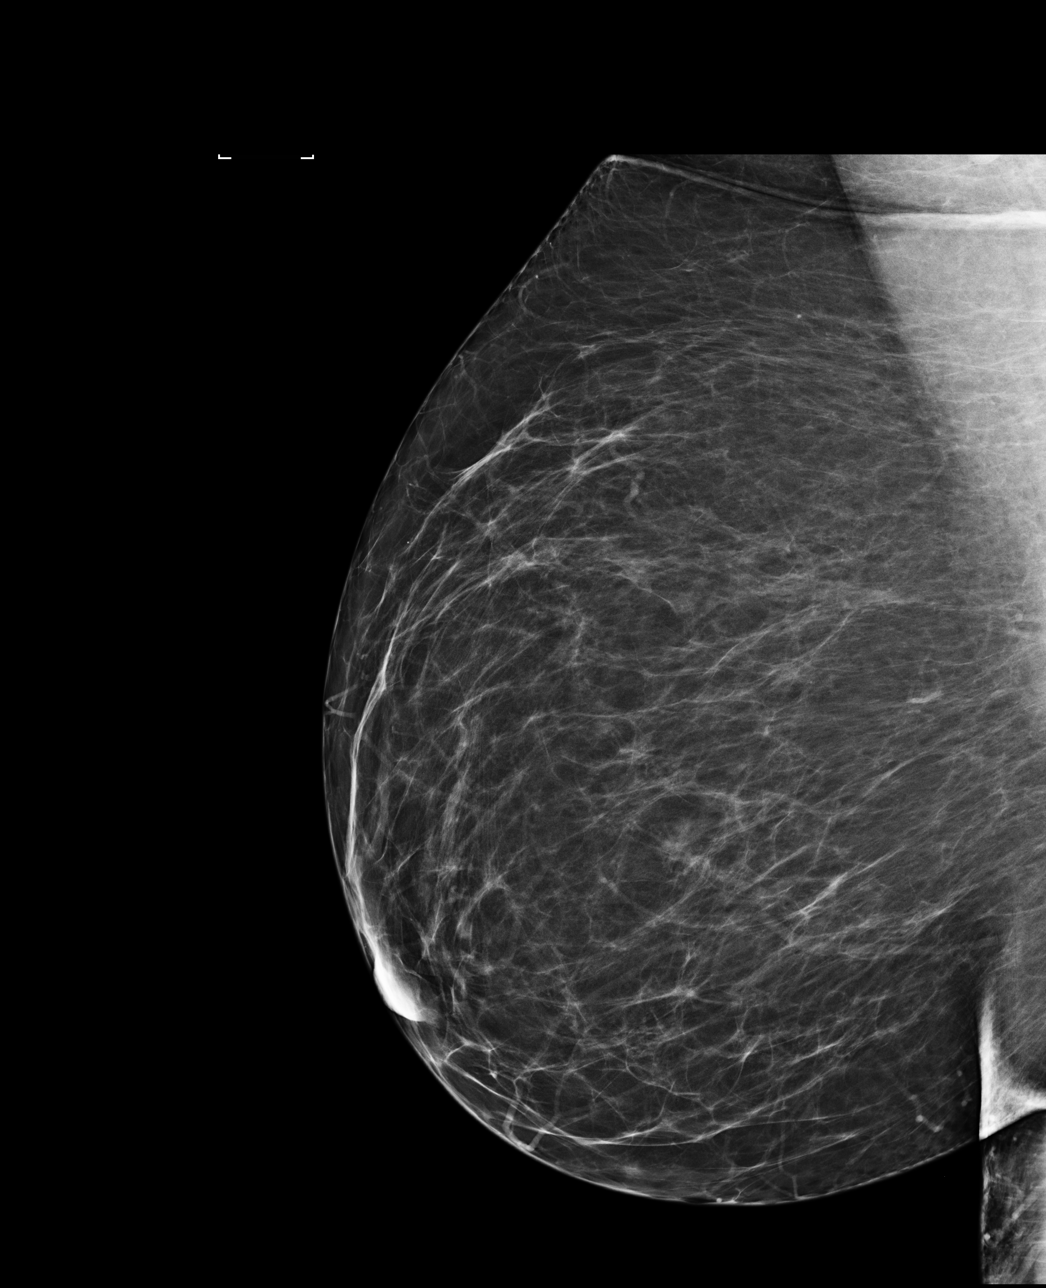

[L MLO]
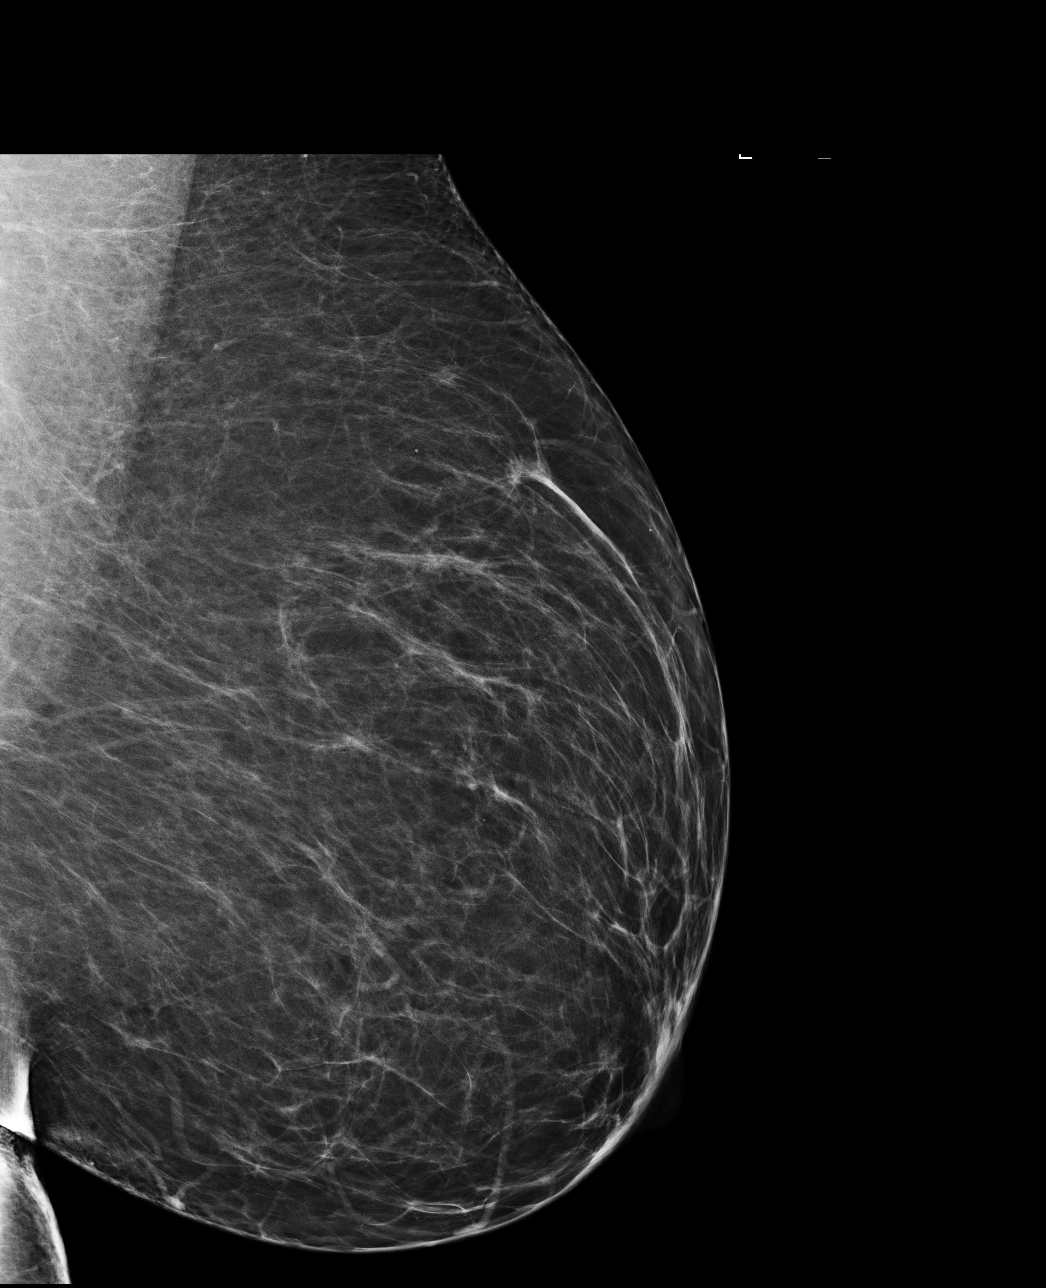

[L CC]
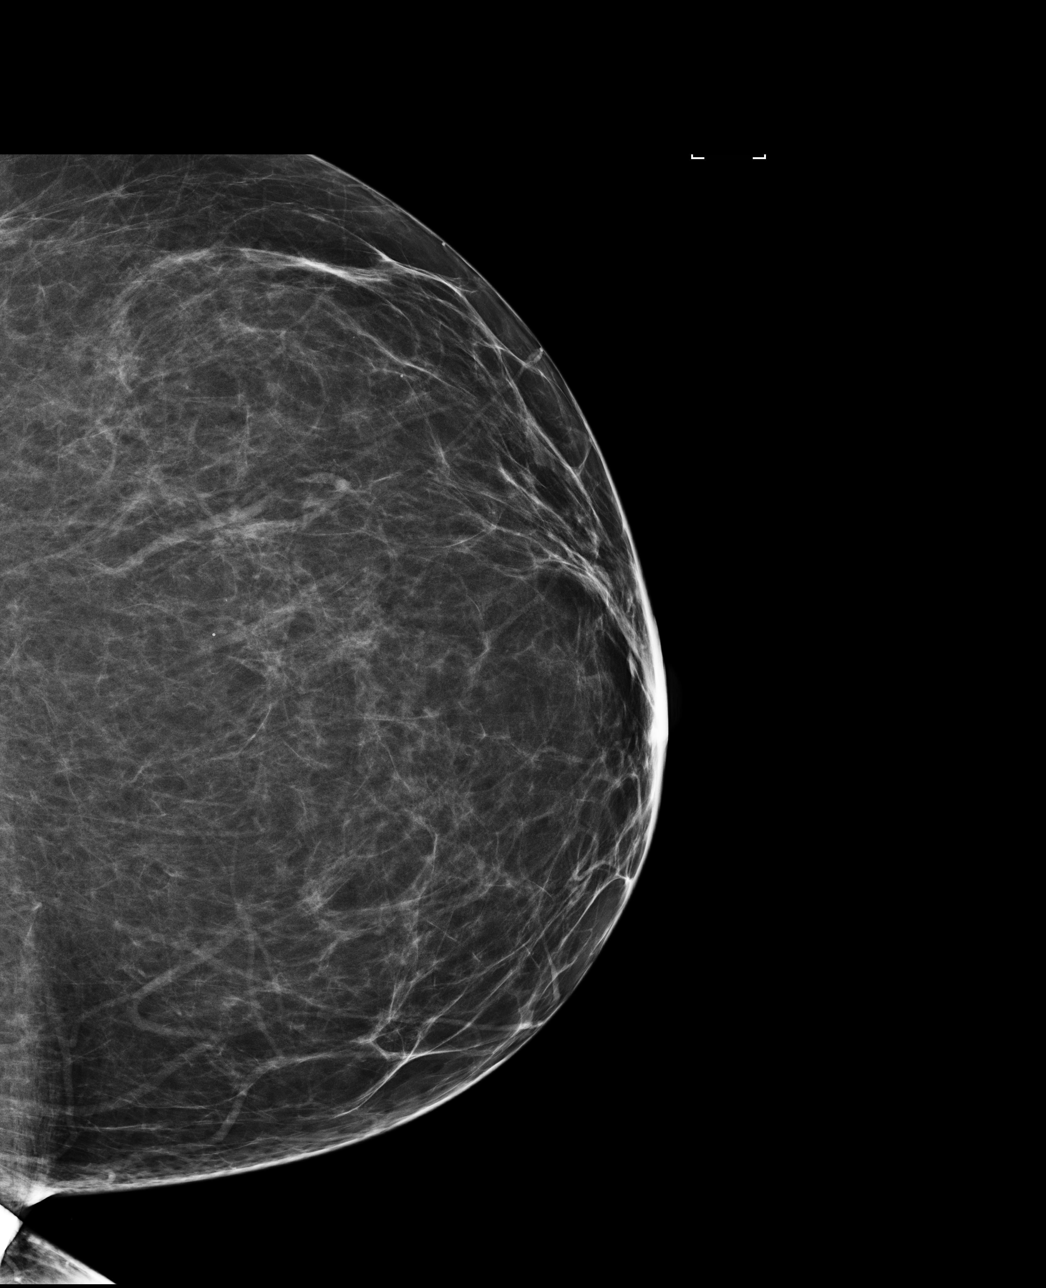

[R CC]
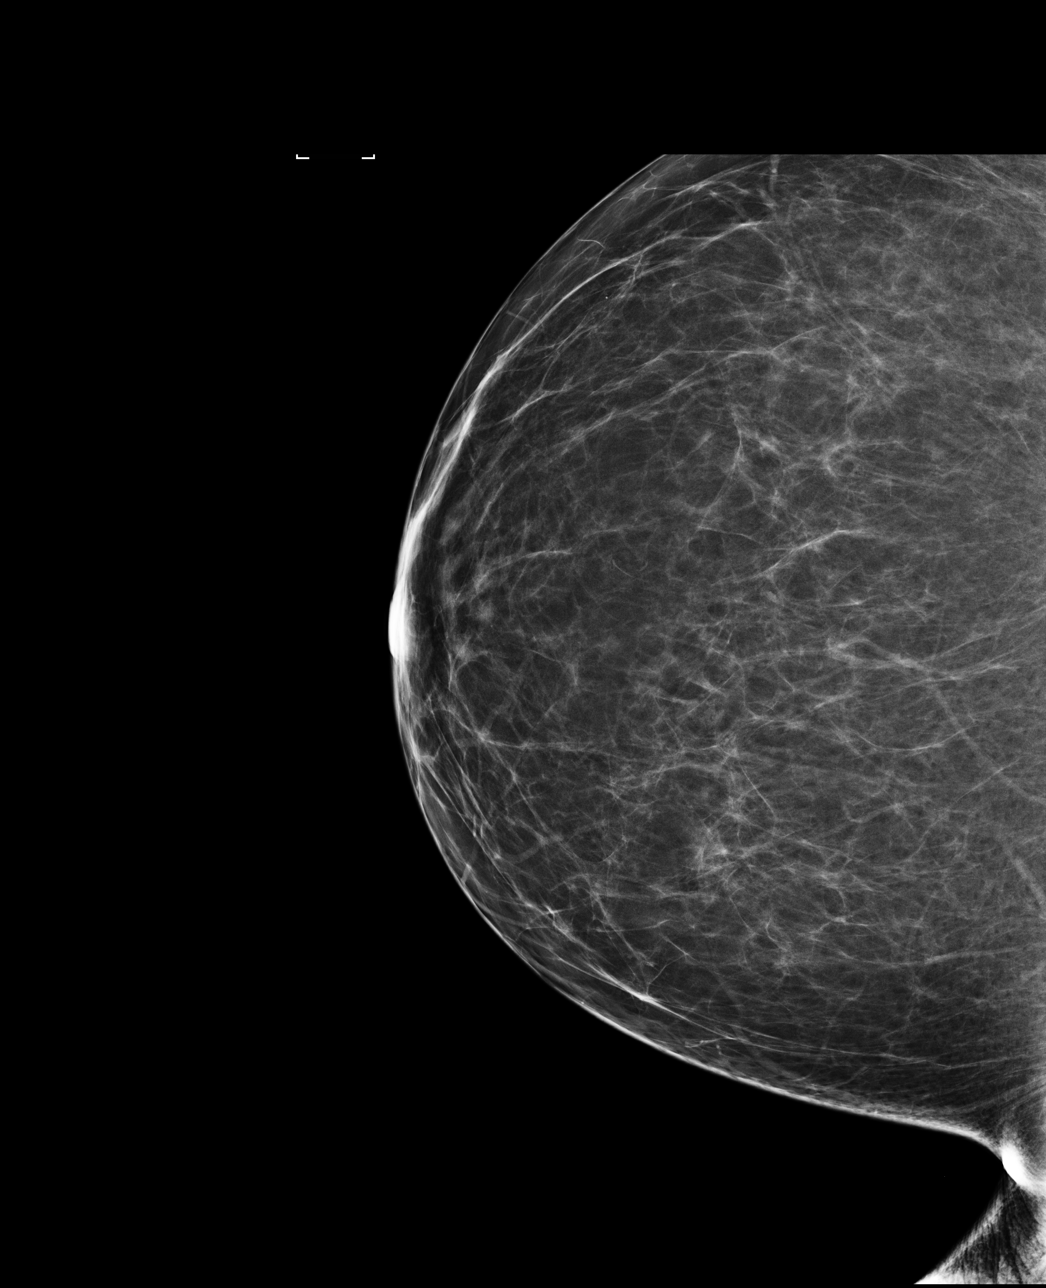

[4 of 4 positions shown; findings below may reference images not displayed]

FINDINGS: There are no findings suspicious for malignancy. Images were
processed with CAD.
IMPRESSION: No mammographic evidence of malignancy. A result letter of this
screening mammogram will be mailed directly to the patient.

RECOMMENDATION:
Screening mammogram in one year. (Code:[id number])

BI-RADS CATEGORY  1: Negative.

## 2017-11-28 DIAGNOSIS — R3129 Other microscopic hematuria: Secondary | ICD-10-CM | POA: Diagnosis not present

## 2017-11-28 DIAGNOSIS — R109 Unspecified abdominal pain: Secondary | ICD-10-CM | POA: Diagnosis not present

## 2017-12-03 ENCOUNTER — Encounter: Payer: Self-pay | Admitting: Emergency Medicine

## 2017-12-03 ENCOUNTER — Ambulatory Visit
Admission: EM | Admit: 2017-12-03 | Discharge: 2017-12-03 | Disposition: A | Discharge status: Home / Self Care | Payer: Medicare Other | Attending: Family Medicine | Admitting: Family Medicine

## 2017-12-03 ENCOUNTER — Other Ambulatory Visit: Payer: Self-pay

## 2017-12-03 DIAGNOSIS — J988 Other specified respiratory disorders: Secondary | ICD-10-CM | POA: Diagnosis not present

## 2017-12-03 MED ORDER — DOXYCYCLINE HYCLATE 100 MG PO CAPS: 100.0000 mg | ORAL_CAPSULE | Freq: Two times a day (BID) | ORAL | 0 refills | Status: DC

## 2017-12-03 NOTE — Discharge Instructions (Signed)
Antibiotic as prescribed.  Hope you feel better  Take care  Dr. Lacinda Axon

## 2017-12-03 NOTE — ED Triage Notes (Signed)
Patient c/o cough, congestion, sinus congestion and stuffy nose for 3 weeks.  Patient denies fevers.

## 2017-12-03 NOTE — ED Provider Notes (Signed)
[facility]    CSN: [id number] Arrival date & time: 12/03/17  0850  History   Chief Complaint Chief Complaint  Patient presents with  . Sinus Problem   HPI  56 year old female with a history of lymphoma who presents with cough, congestion.  Patient reports that she has been sick for 3 weeks.  She has had sinus congestion and also chest congestion.  She has had severe cough and runny nose.  She states that her daughter has recently been sick.  She states that she has not taken any medication.  She was hoping that this was going to resolve on its own.  No fever.  No shortness of breath.  No known exacerbating relieving factors.  She reports that her cough is productive of discolored sputum.  She also reports discolored discharge from her nose.  No other associated symptoms.  No other complaints at this time.  Past Medical History:  Diagnosis Date  . Cancer ([place]) 2014   lymphoma- had chemo and rad  . Hypertension   . Neuropathy   . Personal history of chemotherapy   . Personal history of radiation therapy    Past Surgical History:  Procedure Laterality Date  . KNEE ARTHROSCOPY     both knees   OB History    No data available     Home Medications    Prior to Admission medications   Medication Sig Start Date End Date Taking? Authorizing Provider  amLODipine (NORVASC) 5 MG tablet Take 5 mg by mouth daily.   Yes [provider]  celecoxib (CELEBREX) 200 MG capsule Take 200 mg by mouth 2 (two) times daily.   Yes [provider]  gabapentin (NEURONTIN) 300 MG capsule Take 300 mg by mouth at bedtime.   Yes [provider]  HYDROcodone-acetaminophen (NORCO/VICODIN) 5-325 MG tablet 1-2 tabs po q 8 hours prn 07/04/17  Yes Conty, [place], MD  meloxicam (MOBIC) 15 MG tablet Take 1 tablet (15 mg total) by mouth daily. 02/20/16  Yes Cuthriell, Charline Bills, PA-C  ondansetron (ZOFRAN ODT) 8 MG disintegrating tablet Take 1 tablet (8 mg total) by mouth every  8 (eight) hours as needed. 07/04/17  Yes Norval Gable, MD  doxycycline (VIBRAMYCIN) 100 MG capsule Take 1 capsule (100 mg total) by mouth 2 (two) times daily. 12/03/17   Coral Spikes, DO    Family History Family History  Problem Relation Age of Onset  . Cancer Mother   . Hypertension Father   . Diabetes Father   . Breast cancer Neg Hx     Social History Social History   Tobacco Use  . Smoking status: Never Smoker  . Smokeless tobacco: Never Used  Substance Use Topics  . Alcohol use: No  . Drug use: No   Allergies   Shellfish allergy and Compazine [prochlorperazine edisylate]   Review of Systems Review of Systems  Constitutional: Negative for fever.  HENT: Positive for congestion, rhinorrhea, sinus pressure and sinus pain.   Respiratory: Positive for cough. Negative for shortness of breath.    Physical Exam Triage Vital Signs ED Triage Vitals  Enc Vitals Group     BP 12/03/17 0926 (!) 142/70     Pulse Rate 12/03/17 0926 65     Resp 12/03/17 0926 16     Temp 12/03/17 0926 98.3 F (36.8 C)     Temp Source 12/03/17 0926 Oral     SpO2 12/03/17 0926 98 %     Weight 12/03/17 0922 247  lb (112 kg)     Height 12/03/17 0922 5\' 5"  (1.651 m)     Head Circumference --      Peak Flow --      Pain Score 12/03/17 0923 0     Pain Loc --      Pain Edu? --      Excl. in [place]? --    Updated Vital Signs BP (!) 142/70 (BP Location: Left Arm)   Pulse 65   Temp 98.3 F (36.8 C) (Oral)   Resp 16   Ht 5\' 5"  (1.651 m)   Wt 247 lb (112 kg)   SpO2 98%   BMI 41.10 kg/m   Visual Acuity Right Eye Distance:   Left Eye Distance:   Bilateral Distance:    Right Eye Near:   Left Eye Near:    Bilateral Near:     Physical Exam  Constitutional: She is oriented to person, place, and time. She appears well-developed. No distress.  HENT:  Head: Normocephalic and atraumatic.  Mouth/Throat: Oropharynx is clear and moist.  Frontal sinus tenderness to palpation.  Eyes: Conjunctivae are  normal. Right eye exhibits no discharge. Left eye exhibits no discharge.  Neck: Neck supple.  Cardiovascular: Normal rate and regular rhythm.  No murmur heard. Pulmonary/Chest: Effort normal and breath sounds normal. She has no wheezes. She has no rales.  Lymphadenopathy:    She has no cervical adenopathy.  Neurological: She is alert and oriented to person, place, and time.  Psychiatric: She has a normal mood and affect. Her behavior is normal.  Vitals reviewed.  UC Treatments / Results  Labs (all labs ordered are listed, but only abnormal results are displayed) Labs Reviewed - No data to display  EKG  EKG Interpretation None       Radiology No results found.  Procedures Procedures (including critical care time)  Medications Ordered in UC Medications - No data to display   Initial Impression / Assessment and Plan / UC Course  I have reviewed the triage vital signs and the nursing notes.  Pertinent labs & imaging results that were available during my care of the patient were reviewed by me and considered in my medical decision making (see chart for details).     56 year old female presents with a respiratory infection.  She has been sick for 3 weeks.  Treating empirically with doxycycline.  Final Clinical Impressions(s) / UC Diagnoses   Final diagnoses:  Respiratory infection    ED Discharge Orders        Ordered    doxycycline (VIBRAMYCIN) 100 MG capsule  2 times daily     12/03/17 1011     Controlled Substance Prescriptions [place] Controlled Substance Registry consulted? Not Applicable   Coral Spikes, DO 12/03/17 1049

## 2018-01-02 DIAGNOSIS — R3129 Other microscopic hematuria: Secondary | ICD-10-CM | POA: Diagnosis not present

## 2018-01-14 DIAGNOSIS — C8358 Lymphoblastic (diffuse) lymphoma, lymph nodes of multiple sites: Secondary | ICD-10-CM | POA: Diagnosis not present

## 2018-03-17 DIAGNOSIS — I10 Essential (primary) hypertension: Secondary | ICD-10-CM | POA: Diagnosis not present

## 2018-03-17 DIAGNOSIS — M1990 Unspecified osteoarthritis, unspecified site: Secondary | ICD-10-CM | POA: Diagnosis not present

## 2018-03-17 DIAGNOSIS — G620 Drug-induced polyneuropathy: Secondary | ICD-10-CM | POA: Diagnosis not present

## 2018-03-17 DIAGNOSIS — C8358 Lymphoblastic (diffuse) lymphoma, lymph nodes of multiple sites: Secondary | ICD-10-CM | POA: Diagnosis not present

## 2018-03-17 DIAGNOSIS — R3129 Other microscopic hematuria: Secondary | ICD-10-CM | POA: Diagnosis not present

## 2018-04-17 ENCOUNTER — Other Ambulatory Visit: Payer: Self-pay | Admitting: Family Medicine

## 2018-04-17 DIAGNOSIS — Z1231 Encounter for screening mammogram for malignant neoplasm of breast: Secondary | ICD-10-CM

## 2018-04-23 ENCOUNTER — Encounter (INDEPENDENT_AMBULATORY_CARE_PROVIDER_SITE_OTHER): Payer: Self-pay

## 2018-04-23 ENCOUNTER — Ambulatory Visit
Admission: RE | Admit: 2018-04-23 | Discharge: 2018-04-23 | Disposition: A | Discharge status: Home / Self Care | Payer: Medicare Other | Source: Ambulatory Visit | Attending: Family Medicine | Admitting: Family Medicine

## 2018-04-23 DIAGNOSIS — Z1231 Encounter for screening mammogram for malignant neoplasm of breast: Secondary | ICD-10-CM | POA: Diagnosis not present

## 2018-05-27 IMAGING — CR DG FINGER THUMB 2+V*L*
3 series · 4 of 4 positions shown · non-contrast
Comparison: None.

CLINICAL DATA: Traumatic thumb injury with pain near the
carpometacarpal articulation.

EXAM:
LEFT THUMB 2+V

[finger ap]
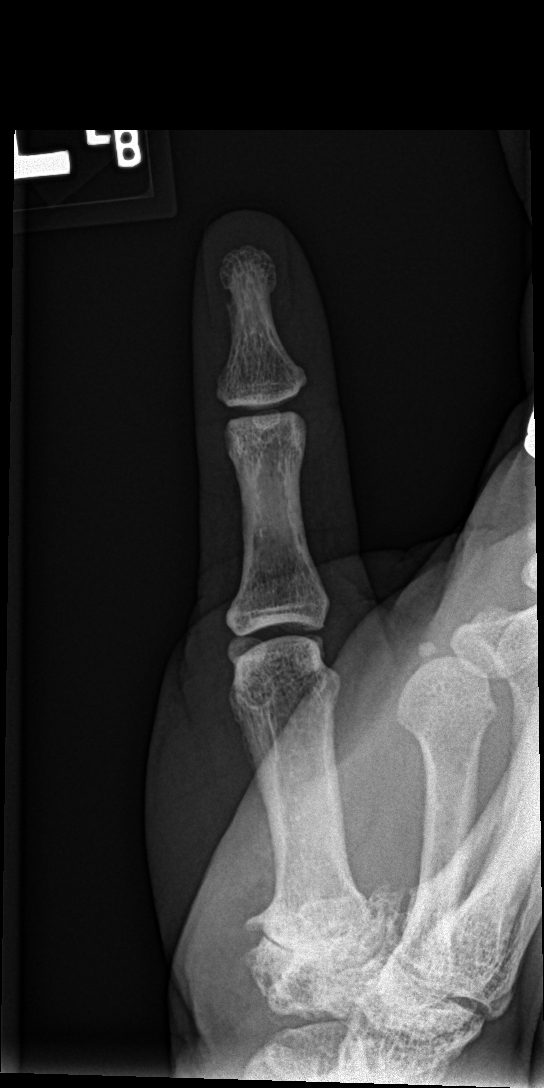

[finger obl]
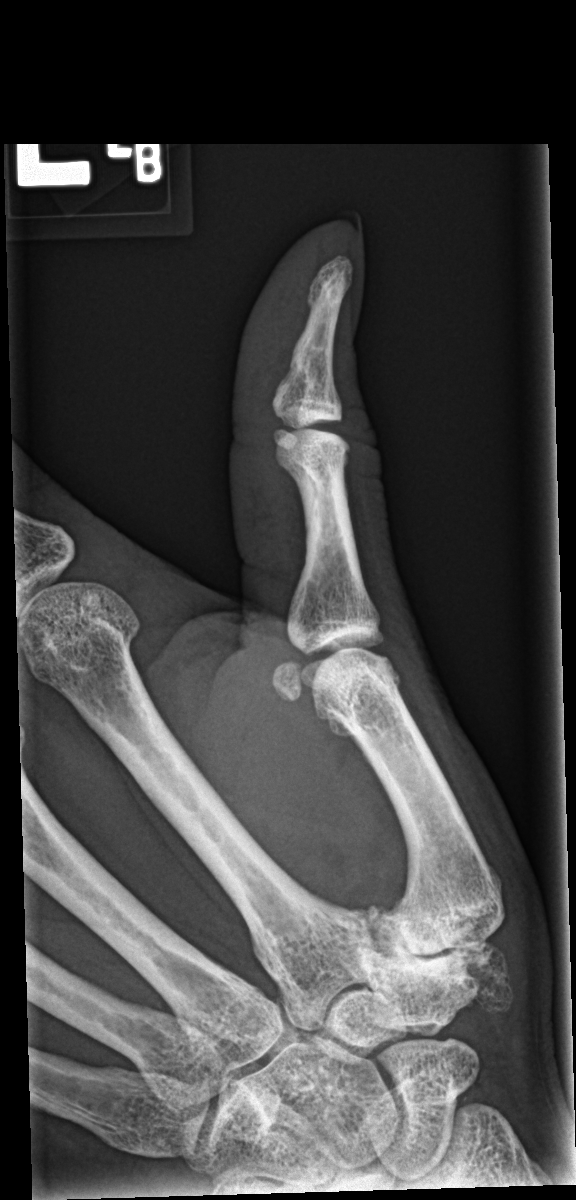

[Series 3: finger lat · 0.14mm/px · 2 of 2 slices shown]
[im 1/2]
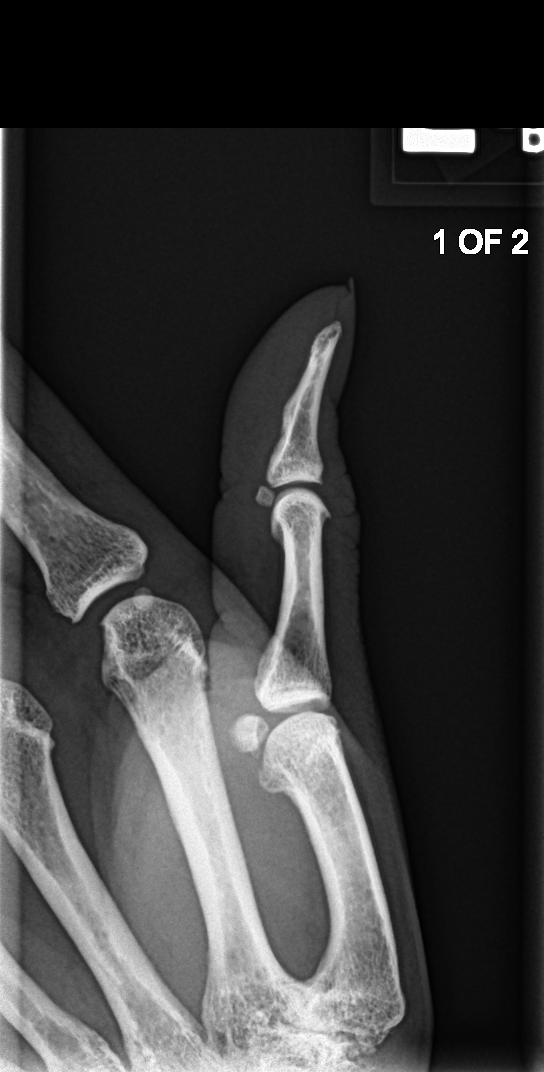
[im 2/2]
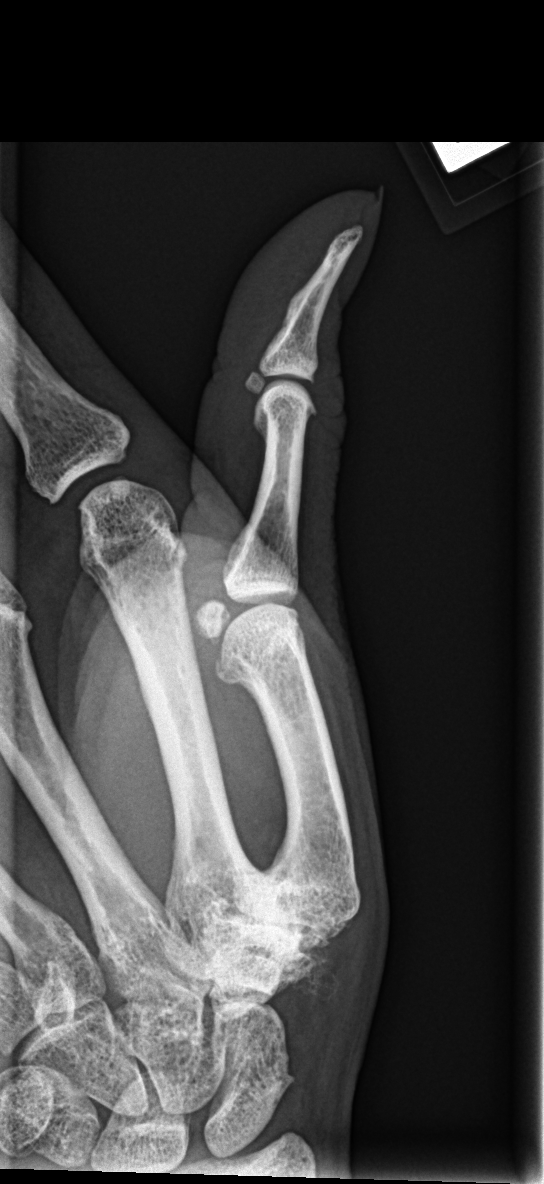

[4 of 4 positions shown; findings below may reference images not displayed]

FINDINGS: Prominent and irregular spurring and loss of articular space at the
first CMC joint compatible with degenerative arthropathy. No
discrete fracture or foreign body identified. Mild degenerative
spurring at the MCP joint and interphalangeal joint.
IMPRESSION: 1. Prominent degenerative arthropathy at the first carpometacarpal
articulation. Mild degenerative arthropathy at the thumb MCP joint
and interphalangeal joint. No appreciable fracture.

## 2018-06-06 HISTORY — PX: ANKLE FRACTURE SURGERY: SHX122

## 2018-06-26 ENCOUNTER — Encounter: Payer: Self-pay | Admitting: Emergency Medicine

## 2018-06-26 ENCOUNTER — Ambulatory Visit: Payer: Medicare Other

## 2018-06-26 ENCOUNTER — Other Ambulatory Visit: Payer: Self-pay

## 2018-06-26 ENCOUNTER — Ambulatory Visit
Admission: EM | Admit: 2018-06-26 | Discharge: 2018-06-26 | Disposition: A | Discharge status: Home / Self Care | Payer: Medicare Other | Attending: Family Medicine | Admitting: Family Medicine

## 2018-06-26 DIAGNOSIS — Z79899 Other long term (current) drug therapy: Secondary | ICD-10-CM | POA: Diagnosis not present

## 2018-06-26 DIAGNOSIS — Z8249 Family history of ischemic heart disease and other diseases of the circulatory system: Secondary | ICD-10-CM | POA: Insufficient documentation

## 2018-06-26 DIAGNOSIS — I10 Essential (primary) hypertension: Secondary | ICD-10-CM | POA: Insufficient documentation

## 2018-06-26 DIAGNOSIS — Z888 Allergy status to other drugs, medicaments and biological substances status: Secondary | ICD-10-CM | POA: Diagnosis not present

## 2018-06-26 DIAGNOSIS — M25562 Pain in left knee: Secondary | ICD-10-CM

## 2018-06-26 DIAGNOSIS — G629 Polyneuropathy, unspecified: Secondary | ICD-10-CM | POA: Diagnosis not present

## 2018-06-26 MED ORDER — NAPROXEN 500 MG PO TABS: 500.0000 mg | ORAL_TABLET | Freq: Two times a day (BID) | ORAL | 0 refills | Status: DC

## 2018-06-26 NOTE — ED Provider Notes (Signed)
[facility]    CSN: [id number] Arrival date & time: 06/26/18  1646     History   Chief Complaint Chief Complaint  Patient presents with  . Knee Pain    left    HPI Katelyn Harrison is a 57 y.o. female.   HPI  57 year old female presents with left-sided knee pain.  She states that she was jumping rope a couple of weeks ago when she injured it feeling a pulling sensation in the posterior portion of her knee.  She states him to get worse today after she mowed the lawn.  Is taking Celebrex for arthritis in her right knee.  This is not seem to help at all.  She states that the knee will have a locking sensation whenever she is been sitting for a while and gets up she has to move her leg around in order to straighten it out.  When the knee is in extension it does not seem to hurt as badly.  Not noticed any swelling.  Had previous injuries to both of her knees while playing softball and required meniscectomies arthroscopically.  She is limping with ambulation.      Past Medical History:  Diagnosis Date  . Cancer ([place]) 2014   lymphoma- had chemo and rad  . Hypertension   . Neuropathy   . Personal history of chemotherapy   . Personal history of radiation therapy     There are no active problems to display for this patient.   Past Surgical History:  Procedure Laterality Date  . ANKLE FRACTURE SURGERY Right 06/06/2018  . KNEE ARTHROSCOPY     both knees  . KNEE SURGERY Bilateral    20+ years ago    OB History   None      Home Medications    Prior to Admission medications   Medication Sig Start Date End Date Taking? Authorizing Provider  amLODipine (NORVASC) 5 MG tablet Take 5 mg by mouth daily.   Yes [provider]  celecoxib (CELEBREX) 200 MG capsule Take 200 mg by mouth 2 (two) times daily.   Yes [provider]  gabapentin (NEURONTIN) 300 MG capsule Take 300 mg by mouth at bedtime.   Yes [provider]    HYDROcodone-acetaminophen (NORCO/VICODIN) 5-325 MG tablet 1-2 tabs po q 8 hours prn 07/04/17  Yes Conty, [place], MD  ondansetron (ZOFRAN ODT) 8 MG disintegrating tablet Take 1 tablet (8 mg total) by mouth every 8 (eight) hours as needed. 07/04/17  Yes Norval Gable, MD  naproxen (NAPROSYN) 500 MG tablet Take 1 tablet (500 mg total) by mouth 2 (two) times daily with a meal. 06/26/18   Lorin Picket, PA-C    Family History Family History  Problem Relation Age of Onset  . Cancer Mother   . Breast cancer Mother 37  . Hypertension Father   . Diabetes Father     Social History Social History   Tobacco Use  . Smoking status: Never Smoker  . Smokeless tobacco: Never Used  Substance Use Topics  . Alcohol use: No  . Drug use: No     Allergies   Shellfish allergy and Compazine [prochlorperazine edisylate]   Review of Systems Review of Systems   Physical Exam Triage Vital Signs ED Triage Vitals  Enc Vitals Group     BP 06/26/18 1727 139/66     Pulse Rate 06/26/18 1727 67     Resp 06/26/18 1727 17     Temp 06/26/18 1727  98.4 F (36.9 C)     Temp Source 06/26/18 1727 Oral     SpO2 06/26/18 1727 98 %     Weight 06/26/18 1722 245 lb (111.1 kg)     Height 06/26/18 1722 5\' 5"  (1.651 m)     Head Circumference --      Peak Flow --      Pain Score 06/26/18 1722 7     Pain Loc --      Pain Edu? --      Excl. in [place]? --    No data found.  Updated Vital Signs BP 139/66 (BP Location: Left Arm)   Pulse 67   Temp 98.4 F (36.9 C) (Oral)   Resp 17   Ht 5\' 5"  (1.651 m)   Wt 245 lb (111.1 kg)   SpO2 98%   BMI 40.77 kg/m   Visual Acuity Right Eye Distance:   Left Eye Distance:   Bilateral Distance:    Right Eye Near:   Left Eye Near:    Bilateral Near:     Physical Exam   UC Treatments / Results  Labs (all labs ordered are listed, but only abnormal results are displayed) Labs Reviewed - No data to display  EKG None  Radiology Dg Knee Complete 4 Views  Left  Result Date: 06/26/2018 CLINICAL DATA:  Medial knee tenderness EXAM: LEFT KNEE - COMPLETE 4+ VIEW COMPARISON:  None. FINDINGS: No evidence of fracture, dislocation, or joint effusion. Small osteophytes at the medial and lateral surfaces of the patella and at the medial articular surface of the femur. Joint spaces are preserved. IMPRESSION: No acute abnormality of the left knee. Electronically Signed   By: Ulyses Jarred M.D.   On: 06/26/2018 18:20    Procedures Procedures (including critical care time)  Medications Ordered in UC Medications - No data to display  Initial Impression / Assessment and Plan / UC Course  I have reviewed the triage vital signs and the nursing notes.  Pertinent labs & imaging results that were available during my care of the patient were reviewed by me and considered in my medical decision making (see chart for details).     Plan: 1. Test/x-ray results and diagnosis reviewed with patient 2. rx as per orders; risks, benefits, potential side effects reviewed with patient 3. Recommend supportive treatment with rest and symptom avoidance.  Knee immobilizer for activities.  May be out of the knee immobilizer for quiet times and in bed.  Start her on Naprosyn and she will stop the Celebrex for the time being.  Recommend following up with the orthopedic surgeon next week.  He was instructed in quadricep strengthening exercises while using the brace.  Use ice and elevation as necessary to control pain 4. F/u prn if symptoms worsen or don't improve  Final Clinical Impressions(s) / UC Diagnoses   Final diagnoses:  Left medial knee pain     Discharge Instructions     Apply ice 20 minutes out of every 2 hours 4-5 times daily for comfort.  Knee immobilizer for activities.  May consider using crutches for ambulation if painful.  Recommend following up with your orthopedic surgeon in a week or 2 for evaluation.    ED Prescriptions    Medication Sig Dispense Auth.  Provider   naproxen (NAPROSYN) 500 MG tablet Take 1 tablet (500 mg total) by mouth 2 (two) times daily with a meal. 60 tablet Lorin Picket, PA-C     Controlled Substance Prescriptions [place]  Controlled Substance Registry consulted? Not Applicable   Lorin Picket, PA-C 06/26/18 2030

## 2018-06-26 NOTE — ED Triage Notes (Signed)
Pt is here today for left knee pain. She reports it started hurting a couple of weeks ago but got worse today. She was jumping rope a couple of weeks ago and injured it. She recalls that something may have popped when she initially hurt her knee. The pain is located in the back for the knee. She has not tried anything for the pain. She is already taking NSAID's for arthritis in her right knee.

## 2018-06-26 NOTE — Discharge Instructions (Signed)
Apply ice 20 minutes out of every 2 hours 4-5 times daily for comfort.  Knee immobilizer for activities.  May consider using crutches for ambulation if painful.  Recommend following up with your orthopedic surgeon in a week or 2 for evaluation.

## 2018-07-15 DIAGNOSIS — C8358 Lymphoblastic (diffuse) lymphoma, lymph nodes of multiple sites: Secondary | ICD-10-CM | POA: Diagnosis not present

## 2018-12-29 ENCOUNTER — Other Ambulatory Visit: Payer: Self-pay

## 2018-12-29 ENCOUNTER — Encounter: Payer: Self-pay | Admitting: Emergency Medicine

## 2018-12-29 ENCOUNTER — Ambulatory Visit
Admission: EM | Admit: 2018-12-29 | Discharge: 2018-12-29 | Disposition: A | Discharge status: Home / Self Care | Payer: Medicare Other | Attending: Family Medicine | Admitting: Family Medicine

## 2018-12-29 DIAGNOSIS — J029 Acute pharyngitis, unspecified: Secondary | ICD-10-CM | POA: Diagnosis not present

## 2018-12-29 DIAGNOSIS — Z79899 Other long term (current) drug therapy: Secondary | ICD-10-CM | POA: Insufficient documentation

## 2018-12-29 DIAGNOSIS — I10 Essential (primary) hypertension: Secondary | ICD-10-CM | POA: Insufficient documentation

## 2018-12-29 DIAGNOSIS — R05 Cough: Secondary | ICD-10-CM | POA: Diagnosis not present

## 2018-12-29 DIAGNOSIS — Z91013 Allergy to seafood: Secondary | ICD-10-CM | POA: Insufficient documentation

## 2018-12-29 DIAGNOSIS — Z888 Allergy status to other drugs, medicaments and biological substances status: Secondary | ICD-10-CM | POA: Diagnosis not present

## 2018-12-29 LAB — RAPID STREP SCREEN (MED CTR MEBANE ONLY): Streptococcus, Group A Screen (Direct): NEGATIVE

## 2018-12-29 MED ORDER — BENZONATATE 100 MG PO CAPS: 100.0000 mg | ORAL_CAPSULE | Freq: Three times a day (TID) | ORAL | 0 refills | Status: DC | PRN

## 2018-12-29 MED ORDER — AMOXICILLIN 500 MG PO TABS: 500.0000 mg | ORAL_TABLET | Freq: Two times a day (BID) | ORAL | 0 refills | Status: DC

## 2018-12-29 NOTE — ED Triage Notes (Signed)
Patient c/o sore throat and cough that started on Saturday. Denies fever. Patient has been taking Dayquil for her symptoms.

## 2018-12-29 NOTE — ED Provider Notes (Signed)
[facility]    CSN: [id number] Arrival date & time: 12/29/18  9798  History   Chief Complaint Chief Complaint  Patient presents with  . Sore Throat  . Cough   HPI  57 year old female presents with sore throat and cough.  Symptoms started on Saturday.  She reports severe sore throat, cough.  She also reports that she had body aches on Saturday.  No documented fever.  She has taken DayQuil without relief.  No known exacerbating factors.  No other associated symptoms.  No other complaints.  PMH, Surgical Hx, Family Hx, Social History reviewed and updated as below.  Past Medical History:  Diagnosis Date  . Cancer ([place]) 2014   lymphoma- had chemo and rad  . Hypertension   . Neuropathy   . Personal history of chemotherapy   . Personal history of radiation therapy    Past Surgical History:  Procedure Laterality Date  . ANKLE FRACTURE SURGERY Right 06/06/2018  . KNEE ARTHROSCOPY     both knees  . KNEE SURGERY Bilateral    20+ years ago   OB History   No obstetric history on file.    Home Medications    Prior to Admission medications   Medication Sig Start Date End Date Taking? Authorizing Provider  amLODipine (NORVASC) 5 MG tablet Take 5 mg by mouth daily.   Yes [provider]  celecoxib (CELEBREX) 200 MG capsule Take 200 mg by mouth 2 (two) times daily.   Yes [provider]  gabapentin (NEURONTIN) 300 MG capsule Take 300 mg by mouth at bedtime.   Yes [provider]  HYDROcodone-acetaminophen (NORCO/VICODIN) 5-325 MG tablet 1-2 tabs po q 8 hours prn 07/04/17  Yes Conty, Linward Foster, MD  amoxicillin (AMOXIL) 500 MG tablet Take 1 tablet (500 mg total) by mouth 2 (two) times daily. 12/29/18   Coral Spikes, DO  benzonatate (TESSALON) 100 MG capsule Take 1 capsule (100 mg total) by mouth 3 (three) times daily as needed. 12/29/18   Coral Spikes, DO   Family History Family History  Problem Relation Age of Onset  . Cancer Mother   .  Breast cancer Mother 35  . Hypertension Father   . Diabetes Father    Social History Social History   Tobacco Use  . Smoking status: Never Smoker  . Smokeless tobacco: Never Used  Substance Use Topics  . Alcohol use: No  . Drug use: No   Allergies   Shellfish allergy and Compazine [prochlorperazine edisylate]  Review of Systems Review of Systems  Constitutional: Negative for fever.  HENT: Positive for sore throat.   Respiratory: Positive for cough.    Physical Exam Triage Vital Signs ED Triage Vitals  Enc Vitals Group     BP 12/29/18 0850 136/90     Pulse Rate 12/29/18 0850 68     Resp 12/29/18 0850 18     Temp 12/29/18 0850 98.6 F (37 C)     Temp Source 12/29/18 0850 Oral     SpO2 12/29/18 0850 98 %     Weight 12/29/18 0852 240 lb (108.9 kg)     Height 12/29/18 0852 5\' 5"  (1.651 m)     Head Circumference --      Peak Flow --      Pain Score 12/29/18 0852 7     Pain Loc --      Pain Edu? --      Excl. in [place]? --    Updated Vital Signs  BP 136/90 (BP Location: Left Arm)   Pulse 68   Temp 98.6 F (37 C) (Oral)   Resp 18   Ht 5\' 5"  (1.651 m)   Wt 108.9 kg   SpO2 98%   BMI 39.94 kg/m   Visual Acuity Right Eye Distance:   Left Eye Distance:   Bilateral Distance:    Right Eye Near:   Left Eye Near:    Bilateral Near:     Physical Exam Vitals signs and nursing note reviewed.  Constitutional:      General: She is not in acute distress. HENT:     Head: Normocephalic and atraumatic.     Right Ear: Tympanic membrane normal.     Left Ear: Tympanic membrane normal.     Mouth/Throat:     Comments: Oropharynx with severe erythema.  Tonsillar exudate noted on the right. Eyes:     General:        Right eye: No discharge.        Left eye: No discharge.     Conjunctiva/sclera: Conjunctivae normal.  Cardiovascular:     Rate and Rhythm: Normal rate and regular rhythm.  Pulmonary:     Effort: Pulmonary effort is normal.     Breath sounds: No wheezing,  rhonchi or rales.  Neurological:     Mental Status: She is alert.  Psychiatric:        Mood and Affect: Mood normal.        Behavior: Behavior normal.    UC Treatments / Results  Labs (all labs ordered are listed, but only abnormal results are displayed) Labs Reviewed  RAPID STREP SCREEN ([facility] ONLY)  CULTURE, GROUP A STREP [facility])    EKG None  Radiology No results found.  Procedures Procedures (including critical care time)  Medications Ordered in UC Medications - No data to display  Initial Impression / Assessment and Plan / UC Course  I have reviewed the triage vital signs and the nursing notes.  Pertinent labs & imaging results that were available during my care of the patient were reviewed by me and considered in my medical decision making (see chart for details).    57 year old female presents with pharyngitis and associated cough.  Rapid strep was negative.  However, given the appearance of her oropharynx, I am concerned about strep pharyngitis.  Placing on amoxicillin while awaiting culture.  Tessalon perles for cough.  Final Clinical Impressions(s) / UC Diagnoses   Final diagnoses:  Pharyngitis, unspecified etiology     Discharge Instructions     Medication as prescribed.  Take care  Dr. Lacinda Axon    ED Prescriptions    Medication Sig Dispense Auth. Provider   amoxicillin (AMOXIL) 500 MG tablet Take 1 tablet (500 mg total) by mouth 2 (two) times daily. 20 tablet [place]ona Wargo G, DO   benzonatate (TESSALON) 100 MG capsule Take 1 capsule (100 mg total) by mouth 3 (three) times daily as needed. 30 capsule Coral Spikes, DO     Controlled Substance Prescriptions [place] Controlled Substance Registry consulted? Not Applicable   Coral Spikes, [place] 12/29/18 0160

## 2018-12-29 NOTE — Discharge Instructions (Signed)
Medication as prescribed.  Take care  Dr. Fayne Mcguffee  

## 2018-12-30 LAB — CULTURE, GROUP A STREP (THRC)

## 2019-01-02 ENCOUNTER — Telehealth (HOSPITAL_COMMUNITY): Payer: Self-pay | Admitting: Emergency Medicine

## 2019-01-02 NOTE — Telephone Encounter (Signed)
Culture is positive for group A Strep germ. Given amoxicillin. Pt contacted and made aware, all questions answered.

## 2019-01-13 DIAGNOSIS — Z23 Encounter for immunization: Secondary | ICD-10-CM | POA: Diagnosis not present

## 2019-01-13 DIAGNOSIS — C8358 Lymphoblastic (diffuse) lymphoma, lymph nodes of multiple sites: Secondary | ICD-10-CM | POA: Diagnosis not present

## 2019-01-13 DIAGNOSIS — M545 Low back pain: Secondary | ICD-10-CM | POA: Diagnosis not present

## 2019-01-13 DIAGNOSIS — G8929 Other chronic pain: Secondary | ICD-10-CM | POA: Diagnosis not present

## 2019-01-13 DIAGNOSIS — G620 Drug-induced polyneuropathy: Secondary | ICD-10-CM | POA: Diagnosis not present

## 2019-01-13 DIAGNOSIS — M25561 Pain in right knee: Secondary | ICD-10-CM | POA: Diagnosis not present

## 2019-07-07 DIAGNOSIS — C8358 Lymphoblastic (diffuse) lymphoma, lymph nodes of multiple sites: Secondary | ICD-10-CM | POA: Diagnosis not present

## 2019-09-10 DIAGNOSIS — I10 Essential (primary) hypertension: Secondary | ICD-10-CM | POA: Diagnosis not present

## 2019-09-10 DIAGNOSIS — G620 Drug-induced polyneuropathy: Secondary | ICD-10-CM | POA: Diagnosis not present

## 2019-09-10 DIAGNOSIS — Z8579 Personal history of other malignant neoplasms of lymphoid, hematopoietic and related tissues: Secondary | ICD-10-CM | POA: Diagnosis not present

## 2019-09-10 DIAGNOSIS — Z23 Encounter for immunization: Secondary | ICD-10-CM | POA: Diagnosis not present

## 2019-09-14 ENCOUNTER — Other Ambulatory Visit: Payer: Self-pay | Admitting: Family Medicine

## 2019-09-14 DIAGNOSIS — Z1231 Encounter for screening mammogram for malignant neoplasm of breast: Secondary | ICD-10-CM

## 2019-09-28 ENCOUNTER — Encounter (INDEPENDENT_AMBULATORY_CARE_PROVIDER_SITE_OTHER): Payer: Self-pay

## 2019-09-28 ENCOUNTER — Ambulatory Visit
Admission: RE | Admit: 2019-09-28 | Discharge: 2019-09-28 | Disposition: A | Discharge status: Home / Self Care | Payer: Medicare Other | Source: Ambulatory Visit | Attending: Family Medicine | Admitting: Family Medicine

## 2019-09-28 ENCOUNTER — Other Ambulatory Visit: Payer: Self-pay

## 2019-09-28 DIAGNOSIS — Z1231 Encounter for screening mammogram for malignant neoplasm of breast: Secondary | ICD-10-CM | POA: Diagnosis not present

## 2019-10-07 DIAGNOSIS — Z23 Encounter for immunization: Secondary | ICD-10-CM | POA: Diagnosis not present

## 2020-09-05 ENCOUNTER — Ambulatory Visit
Admission: EM | Admit: 2020-09-05 | Discharge: 2020-09-05 | Disposition: A | Discharge status: Home / Self Care | Payer: Medicare Other | Attending: Internal Medicine | Admitting: Internal Medicine

## 2020-09-05 ENCOUNTER — Other Ambulatory Visit: Payer: Self-pay

## 2020-09-05 ENCOUNTER — Ambulatory Visit: Payer: Self-pay

## 2020-09-05 DIAGNOSIS — M549 Dorsalgia, unspecified: Secondary | ICD-10-CM | POA: Insufficient documentation

## 2020-09-05 LAB — URINALYSIS, COMPLETE (UACMP) WITH MICROSCOPIC
Bilirubin Urine: NEGATIVE
Glucose, UA: NEGATIVE mg/dL
Ketones, ur: NEGATIVE mg/dL
Leukocytes,Ua: NEGATIVE
Nitrite: NEGATIVE
Protein, ur: NEGATIVE mg/dL
Specific Gravity, Urine: 1.02 (ref 1.005–1.030)
pH: 7 (ref 5.0–8.0)

## 2020-09-05 MED ORDER — LIDOCAINE 5 % EX PTCH: 1.0000 | MEDICATED_PATCH | CUTANEOUS | 0 refills | Status: DC

## 2020-09-05 MED ORDER — IBUPROFEN 600 MG PO TABS: 600.0000 mg | ORAL_TABLET | Freq: Four times a day (QID) | ORAL | 0 refills | Status: DC | PRN

## 2020-09-05 NOTE — Discharge Instructions (Signed)
Gentle range of motion exercise  Heating pad is helpful

## 2020-09-05 NOTE — ED Triage Notes (Signed)
Pt started yesterday with left flank pain. No nausea. Has had a small kidney stone in the past. Denies difficulty with urination

## 2020-09-06 NOTE — ED Provider Notes (Signed)
[facility]    CSN: [id number] Arrival date & time: 09/05/20  0854      History   Chief Complaint Chief Complaint  Patient presents with  . Flank Pain    HPI Katelyn Harrison is a 59 y.o. female comes to the urgent care with complaint of left sided lower back pain of 2 days duration.  Patient did yard work the day before.  She denies any trauma to the back.  No fever, chills, nausea or vomiting.  No dysuria urgency or frequency.  Patient has a history of nephrolithiasis with renal colic.   HPI  Past Medical History:  Diagnosis Date  . Cancer ([place]) 2014   lymphoma- had chemo and rad  . Hypertension   . Neuropathy   . Personal history of chemotherapy   . Personal history of radiation therapy     There are no problems to display for this patient.   Past Surgical History:  Procedure Laterality Date  . ANKLE FRACTURE SURGERY Right 06/06/2018  . KNEE ARTHROSCOPY     both knees  . KNEE SURGERY Bilateral    20+ years ago    OB History   No obstetric history on file.      Home Medications    Prior to Admission medications   Medication Sig Start Date End Date Taking? Authorizing Provider  amLODipine (NORVASC) 5 MG tablet Take 5 mg by mouth daily.    [provider]  celecoxib (CELEBREX) 200 MG capsule Take 200 mg by mouth 2 (two) times daily.    [provider]  gabapentin (NEURONTIN) 300 MG capsule Take 300 mg by mouth at bedtime.    [provider]  HYDROcodone-acetaminophen (NORCO/VICODIN) 5-325 MG tablet 1-2 tabs po q 8 hours prn 07/04/17   Norval Gable, MD  ibuprofen (ADVIL) 600 MG tablet Take 1 tablet (600 mg total) by mouth every 6 (six) hours as needed. 09/05/20   Maleeya Peterkin, Myrene Galas, MD  lidocaine (LIDODERM) 5 % Place 1 patch onto the skin daily. Remove & Discard patch within 12 hours or as directed by MD 09/05/20   Daryana Whirley, Myrene Galas, MD    Family History Family History  Problem Relation Age of Onset  . Cancer Mother   .  Breast cancer Mother 5  . Hypertension Father   . Diabetes Father     Social History Social History   Tobacco Use  . Smoking status: Never Smoker  . Smokeless tobacco: Never Used  Vaping Use  . Vaping Use: Never used  Substance Use Topics  . Alcohol use: No  . Drug use: No     Allergies   Shellfish allergy and Compazine [prochlorperazine edisylate]   Review of Systems Review of Systems  Constitutional: Negative.   Genitourinary: Positive for flank pain. Negative for dysuria, genital sores and urgency.  Musculoskeletal: Positive for back pain.  Skin: Negative.   Neurological: Negative.      Physical Exam Triage Vital Signs ED Triage Vitals  Enc Vitals Group     BP 09/05/20 1019 (!) 157/77     Pulse Rate 09/05/20 1019 60     Resp 09/05/20 1019 17     Temp 09/05/20 1019 98.2 F (36.8 C)     Temp Source 09/05/20 1019 Oral     SpO2 09/05/20 1019 100 %     Weight 09/05/20 1018 240 lb (108.9 kg)     Height 09/05/20 1018 5\' 5"  (1.651 m)     Head  Circumference --      Peak Flow --      Pain Score 09/05/20 1017 8     Pain Loc --      Pain Edu? --      Excl. in [place]? --    No data found.  Updated Vital Signs BP (!) 157/77 (BP Location: Left Arm)   Pulse 60   Temp 98.2 F (36.8 C) (Oral)   Resp 17   Ht 5\' 5"  (1.651 m)   Wt 108.9 kg   SpO2 100%   BMI 39.94 kg/m   Visual Acuity Right Eye Distance:   Left Eye Distance:   Bilateral Distance:    Right Eye Near:   Left Eye Near:    Bilateral Near:     Physical Exam Cardiovascular:     Rate and Rhythm: Normal rate and regular rhythm.     Pulses: Normal pulses.     Heart sounds: Normal heart sounds.  Pulmonary:     Effort: Pulmonary effort is normal.     Breath sounds: Normal breath sounds.  Abdominal:     Tenderness: There is no abdominal tenderness. There is no guarding or rebound.  Musculoskeletal:        General: No swelling, tenderness, deformity or signs of injury. Normal range of motion.      Comments: Numbness on palpation over the left paraspinal muscle in the lumbar region.  Skin:    General: Skin is warm.      UC Treatments / Results  Labs (all labs ordered are listed, but only abnormal results are displayed) Labs Reviewed  URINALYSIS, COMPLETE (UACMP) WITH MICROSCOPIC - Abnormal; Notable for the following components:      Result Value   Hgb urine dipstick SMALL (*)    Bacteria, UA FEW (*)    All other components within normal limits    EKG   Radiology No results found.  Procedures Procedures (including critical care time)  Medications Ordered in UC Medications - No data to display  Initial Impression / Assessment and Plan / UC Course  I have reviewed the triage vital signs and the nursing notes.  Pertinent labs & imaging results that were available during my care of the patient were reviewed by me and considered in my medical decision making (see chart for details).    1.  Musculoskeletal back pain: Urinalysis is significant for 6-10 RBC per high-power field This is likely musculoskeletal pain Voltaren cream Ibuprofen 600 mg every 6-8 hours as needed for pain Patient has muscle relaxant at home Lidoderm patch Return precautions given Gentle range of motion exercises. Final Clinical Impressions(s) / UC Diagnoses   Final diagnoses:  Musculoskeletal back pain     Discharge Instructions     Gentle range of motion exercise  Heating pad is helpful    ED Prescriptions    Medication Sig Dispense Auth. Provider   ibuprofen (ADVIL) 600 MG tablet Take 1 tablet (600 mg total) by mouth every 6 (six) hours as needed. 30 tablet Makinley Muscato, Myrene Galas, MD   lidocaine (LIDODERM) 5 % Place 1 patch onto the skin daily. Remove & Discard patch within 12 hours or as directed by MD 30 patch Jesstin Studstill, Myrene Galas, MD     PDMP not reviewed this encounter.   Chase Picket, MD 09/06/20 1242

## 2020-09-26 ENCOUNTER — Other Ambulatory Visit: Payer: Self-pay | Admitting: Family Medicine

## 2020-09-26 DIAGNOSIS — Z1231 Encounter for screening mammogram for malignant neoplasm of breast: Secondary | ICD-10-CM

## 2020-10-10 ENCOUNTER — Inpatient Hospital Stay: Admission: RE | Admit: 2020-10-10 | Payer: Medicare Other | Source: Ambulatory Visit

## 2020-10-17 ENCOUNTER — Other Ambulatory Visit: Payer: Self-pay

## 2020-10-17 ENCOUNTER — Ambulatory Visit
Admission: RE | Admit: 2020-10-17 | Discharge: 2020-10-17 | Disposition: A | Discharge status: Home / Self Care | Payer: Medicare Other | Source: Ambulatory Visit | Attending: Family Medicine | Admitting: Family Medicine

## 2020-10-17 DIAGNOSIS — Z1231 Encounter for screening mammogram for malignant neoplasm of breast: Secondary | ICD-10-CM | POA: Diagnosis present

## 2021-01-25 ENCOUNTER — Other Ambulatory Visit: Payer: Self-pay

## 2021-01-25 ENCOUNTER — Encounter: Payer: Self-pay | Admitting: Emergency Medicine

## 2021-01-25 ENCOUNTER — Ambulatory Visit
Admission: EM | Admit: 2021-01-25 | Discharge: 2021-01-25 | Disposition: A | Discharge status: Home / Self Care | Payer: Medicare Other

## 2021-01-25 DIAGNOSIS — M546 Pain in thoracic spine: Secondary | ICD-10-CM

## 2021-01-25 MED ORDER — TIZANIDINE HCL 4 MG PO TABS: 4.0000 mg | ORAL_TABLET | Freq: Three times a day (TID) | ORAL | 0 refills | Status: AC | PRN

## 2021-01-25 NOTE — ED Triage Notes (Signed)
Pt c/o mid back pain. Started about 5 days ago. She states if she takes a deep breath she can feel the pain more.

## 2021-01-25 NOTE — Discharge Instructions (Addendum)

## 2021-01-25 NOTE — ED Provider Notes (Signed)
[facility]    CSN: [id number] Arrival date & time: 01/25/21  1657      History   Chief Complaint Chief Complaint  Patient presents with  . Back Pain    HPI Katelyn Harrison is a 60 y.o. female presenting for upper left sided back pain x 5 days. She says she gradually noticed the pain. Pain is worsened with movement such as rolling over in bed. She says he also feels the pain more when she takes a deep breath. Denies shortness of breath, difficulty breathing, or chest pain. No dizziness or palpitations. Patient denies any known injury to her back but says she is very active. She denies lower back pain, dysuria, hematuria, n/v/d/c, or vaginal discharge. Has not taken any mediation for symptoms. Does not recall similar problems in past. She does take celecoxib daily for arthritis pain. No other complaints or concerns.   HPI  Past Medical History:  Diagnosis Date  . Cancer ([place]) 2014   lymphoma- had chemo and rad  . Hypertension   . Neuropathy   . Personal history of chemotherapy   . Personal history of radiation therapy     There are no problems to display for this patient.   Past Surgical History:  Procedure Laterality Date  . ANKLE FRACTURE SURGERY Right 06/06/2018  . KNEE ARTHROSCOPY     both knees  . KNEE SURGERY Bilateral    20+ years ago    OB History   No obstetric history on file.      Home Medications    Prior to Admission medications   Medication Sig Start Date End Date Taking? Authorizing Provider  amLODipine (NORVASC) 5 MG tablet Take 5 mg by mouth daily.   Yes [provider]  celecoxib (CELEBREX) 200 MG capsule Take 200 mg by mouth 2 (two) times daily.   Yes [provider]  gabapentin (NEURONTIN) 300 MG capsule Take 300 mg by mouth at bedtime.   Yes [provider]  magnesium oxide (MAG-OX) 400 MG tablet Take 400 mg by mouth daily.   Yes [provider]  Multiple Vitamin (MULTIVITAMIN) tablet Take 1  tablet by mouth daily.   Yes [provider]  tiZANidine (ZANAFLEX) 4 MG tablet Take 1 tablet (4 mg total) by mouth every 8 (eight) hours as needed for up to 10 days for muscle spasms. 01/25/21 02/04/21 Yes Danton Clap, PA-C  HYDROcodone-acetaminophen (NORCO/VICODIN) 5-325 MG tablet 1-2 tabs po q 8 hours prn 07/04/17   Norval Gable, MD  lidocaine (LIDODERM) 5 % Place 1 patch onto the skin daily. Remove & Discard patch within 12 hours or as directed by MD 09/05/20   Lamptey, Myrene Galas, MD    Family History Family History  Problem Relation Age of Onset  . Cancer Mother   . Breast cancer Mother 56  . Hypertension Father   . Diabetes Father     Social History Social History   Tobacco Use  . Smoking status: Never Smoker  . Smokeless tobacco: Never Used  Vaping Use  . Vaping Use: Never used  Substance Use Topics  . Alcohol use: No  . Drug use: No     Allergies   Shellfish allergy and Compazine [prochlorperazine edisylate]   Review of Systems Review of Systems  Constitutional: Negative for fatigue and fever.  Respiratory: Negative for shortness of breath.   Cardiovascular: Negative for chest pain and palpitations.  Gastrointestinal: Negative for abdominal pain, nausea and vomiting.  Genitourinary:  Negative for difficulty urinating, dysuria, hematuria and vaginal discharge.  Musculoskeletal: Positive for back pain. Negative for gait problem.  Neurological: Negative for dizziness, weakness and numbness.     Physical Exam Triage Vital Signs ED Triage Vitals  Enc Vitals Group     BP      Pulse      Resp      Temp      Temp src      SpO2      Weight      Height      Head Circumference      Peak Flow      Pain Score      Pain Loc      Pain Edu?      Excl. in [place]?    No data found.  Updated Vital Signs BP (!) 149/93 (BP Location: Left Arm)   Pulse 73   Temp 98.5 F (36.9 C) (Oral)   Resp 18   Ht 5\' 5"  (1.651 m)   Wt 240 lb 1.3 oz (108.9 kg)   SpO2  100%   BMI 39.95 kg/m    Physical Exam Vitals and nursing note reviewed.  Constitutional:      General: She is not in acute distress.    Appearance: Normal appearance. She is not ill-appearing or toxic-appearing.  HENT:     Head: Normocephalic and atraumatic.  Eyes:     General: No scleral icterus.       Right eye: No discharge.        Left eye: No discharge.     Conjunctiva/sclera: Conjunctivae normal.  Cardiovascular:     Rate and Rhythm: Normal rate and regular rhythm.     Heart sounds: Normal heart sounds.  Pulmonary:     Effort: Pulmonary effort is normal. No respiratory distress.     Breath sounds: Normal breath sounds.  Abdominal:     Palpations: Abdomen is soft.     Tenderness: There is no right CVA tenderness or left CVA tenderness.  Musculoskeletal:     Cervical back: Neck supple.     Thoracic back: Spasms and tenderness (See photo. Patient has TTP in this area.) present. No swelling or bony tenderness. Normal range of motion.     Lumbar back: No tenderness. Normal range of motion.       Back:     Comments: Painful rotation to the left  Skin:    General: Skin is dry.  Neurological:     General: No focal deficit present.     Mental Status: She is alert. Mental status is at baseline.     Motor: No weakness.     Gait: Gait normal.  Psychiatric:        Mood and Affect: Mood normal.        Behavior: Behavior normal.        Thought Content: Thought content normal.      UC Treatments / Results  Labs (all labs ordered are listed, but only abnormal results are displayed) Labs Reviewed - No data to display  EKG   Radiology No results found.  Procedures Procedures (including critical care time)  Medications Ordered in UC Medications - No data to display  Initial Impression / Assessment and Plan / UC Course  I have reviewed the triage vital signs and the nursing notes.  Pertinent labs & imaging results that were available during my care of the patient  were reviewed by me and considered in my medical  decision making (see chart for details).   60 year old female presenting for left-sided upper back pain x5 days.  No trauma or injury.  On exam she does have tenderness to palpation of the left parathoracic muscles.  She also has some increased pain whenever she rotates to the left side.  The rest exam is normal.  Chest is clear to auscultation heart regular rate and rhythm. She denies CP, SOB, palpitations, dizziness.  Exam is consistent with musculoskeletal left back pain.  Patient is already taking celecoxib so advised her she can take Tylenol for pain relief.  Advised local heat and I have sent tizanidine muscle relaxer.  Return and ED precautions for back pain discussed with patient.   Final Clinical Impressions(s) / UC Diagnoses   Final diagnoses:  Acute left-sided thoracic back pain     Discharge Instructions     BACK PAIN: Stressed avoiding painful activities . RICE (REST, ICE, COMPRESSION, ELEVATION) guidelines reviewed. May alternate ice and heat. Consider use of muscle rubs, Salonpas patches, etc. Use medications as directed including muscle relaxers if prescribed. Take anti-inflammatory medications as prescribed or OTC NSAIDs/Tylenol.  F/u with PCP in 7-10 days for reexamination, and please feel free to call or return to the urgent care at any time for any questions or concerns you may have and we will be happy to help you!   BACK PAIN RED FLAGS: If the back pain acutely worsens or there are any red flag symptoms such as numbness/tingling, leg weakness, saddle anesthesia, or loss of bowel/bladder control, go immediately to the ER. Follow up with [place] as scheduled or sooner if the pain does not begin to resolve or if it worsens before the follow up      ED Prescriptions    Medication Sig Dispense Auth. Provider   tiZANidine (ZANAFLEX) 4 MG tablet Take 1 tablet (4 mg total) by mouth every 8 (eight) hours as needed for up to 10 days for  muscle spasms. 20 tablet Danton Clap, PA-C     I have reviewed the PDMP during this encounter.   Danton Clap, PA-C 01/25/21 1737

## 2021-03-30 ENCOUNTER — Ambulatory Visit
Admission: EM | Admit: 2021-03-30 | Discharge: 2021-03-30 | Disposition: A | Discharge status: Home / Self Care | Payer: Medicare Other | Attending: Family Medicine | Admitting: Family Medicine

## 2021-03-30 ENCOUNTER — Other Ambulatory Visit: Payer: Self-pay

## 2021-03-30 ENCOUNTER — Ambulatory Visit (INDEPENDENT_AMBULATORY_CARE_PROVIDER_SITE_OTHER): Payer: Medicare Other

## 2021-03-30 DIAGNOSIS — M25562 Pain in left knee: Secondary | ICD-10-CM | POA: Diagnosis not present

## 2021-03-30 DIAGNOSIS — M1712 Unilateral primary osteoarthritis, left knee: Secondary | ICD-10-CM | POA: Diagnosis not present

## 2021-03-30 MED ORDER — CELECOXIB 200 MG PO CAPS: 200.0000 mg | ORAL_CAPSULE | Freq: Two times a day (BID) | ORAL | 1 refills | Status: AC

## 2021-03-30 NOTE — ED Provider Notes (Signed)
[facility]    CSN: [id number] Arrival date & time: 03/30/21  1215      History   Chief Complaint Chief Complaint  Patient presents with  . Fall  . Knee Pain   HPI  60 year old female presents the above complaint.  Patient states that she suffered a fall on Tuesday.  She states that she slipped on some cardboard and injured her left knee.  She reports lateral knee pain.  Pain 9/10 in severity.  No relieving factors.  No reported swelling or bruising.  Worse with activity.  No other complaints.   Past Medical History:  Diagnosis Date  . Cancer ([place]) 2014   lymphoma- had chemo and rad  . Hypertension   . Neuropathy   . Personal history of chemotherapy   . Personal history of radiation therapy    Past Surgical History:  Procedure Laterality Date  . ANKLE FRACTURE SURGERY Right 06/06/2018  . KNEE ARTHROSCOPY     both knees  . KNEE SURGERY Bilateral    20+ years ago    OB History   No obstetric history on file.      Home Medications    Prior to Admission medications   Medication Sig Start Date End Date Taking? Authorizing Provider  amLODipine (NORVASC) 5 MG tablet Take 5 mg by mouth daily.   Yes [provider]  Doxepin HCl 3 MG TABS Take 1 tablet by mouth at bedtime. 03/28/21  Yes [provider]  gabapentin (NEURONTIN) 300 MG capsule Take 300 mg by mouth at bedtime.   Yes [provider]  HYDROcodone-acetaminophen (NORCO/VICODIN) 5-325 MG tablet 1-2 tabs po q 8 hours prn 07/04/17  Yes Conty, [place], MD  magnesium oxide (MAG-OX) 400 MG tablet Take 400 mg by mouth daily.   Yes [provider]  celecoxib (CELEBREX) 200 MG capsule Take 1 capsule (200 mg total) by mouth 2 (two) times daily. 03/30/21   Coral Spikes, DO    Family History Family History  Problem Relation Age of Onset  . Cancer Mother   . Breast cancer Mother 68  . Hypertension Father   . Diabetes Father     Social History Social History    Tobacco Use  . Smoking status: Never Smoker  . Smokeless tobacco: Never Used  Vaping Use  . Vaping Use: Never used  Substance Use Topics  . Alcohol use: No  . Drug use: No     Allergies   Shellfish allergy and Compazine [prochlorperazine edisylate]   Review of Systems Review of Systems  Constitutional: Negative.   Musculoskeletal:       Left knee pain.   Physical Exam Triage Vital Signs ED Triage Vitals  Enc Vitals Group     BP 03/30/21 1252 (!) 160/90     Pulse Rate 03/30/21 1252 70     Resp 03/30/21 1252 16     Temp 03/30/21 1252 98.4 F (36.9 C)     Temp Source 03/30/21 1252 Oral     SpO2 03/30/21 1252 98 %     Weight 03/30/21 1251 250 lb (113.4 kg)     Height 03/30/21 1251 5\' 4"  (1.626 m)     Head Circumference --      Peak Flow --      Pain Score 03/30/21 1250 9     Pain Loc --      Pain Edu? --      Excl. in [place]? --    Updated Vital Signs  BP (!) 160/90 (BP Location: Left Arm)   Pulse 70   Temp 98.4 F (36.9 C) (Oral)   Resp 16   Ht 5\' 4"  (1.626 m)   Wt 113.4 kg   SpO2 98%   BMI 42.91 kg/m   Visual Acuity Right Eye Distance:   Left Eye Distance:   Bilateral Distance:    Right Eye Near:   Left Eye Near:    Bilateral Near:     Physical Exam Vitals and nursing note reviewed.  Constitutional:      General: She is not in acute distress.    Appearance: Normal appearance. She is obese. She is not ill-appearing.  HENT:     Head: Normocephalic and atraumatic.  Eyes:     General:        Right eye: No discharge.        Left eye: No discharge.     Conjunctiva/sclera: Conjunctivae normal.  Pulmonary:     Effort: Pulmonary effort is normal. No respiratory distress.  Musculoskeletal:     Comments: Left knee -no appreciable swelling.  No anterior joint line tenderness.  Ligaments intact.  Neurological:     Mental Status: She is alert.  Psychiatric:        Mood and Affect: Mood normal.        Behavior: Behavior normal.    UC Treatments /  Results  Labs (all labs ordered are listed, but only abnormal results are displayed) Labs Reviewed - No data to display  EKG   Radiology DG Knee Complete 4 Views Left  Result Date: 03/30/2021 CLINICAL DATA:  Left knee pain after fall EXAM: LEFT KNEE - COMPLETE 4+ VIEW COMPARISON:  06/26/2018 FINDINGS: No acute fracture or dislocation. Moderate tricompartmental osteoarthritis of the left knee most pronounced within the medial and patellofemoral compartments. Findings slightly progressed from prior. Trace joint effusion. Soft tissues within normal limits. IMPRESSION: 1. No acute osseous abnormality. 2. Moderate tricompartmental osteoarthritis of the left knee, slightly progressed from prior. Electronically Signed   By: Davina Poke D.O.   On: 03/30/2021 13:46    Procedures Procedures (including critical care time)  Medications Ordered in UC Medications - No data to display  Initial Impression / Assessment and Plan / UC Course  I have reviewed the triage vital signs and the nursing notes.  Pertinent labs & imaging results that were available during my care of the patient were reviewed by me and considered in my medical decision making (see chart for details).    60 year old female presents with left knee pain following recent fall.  X-ray was obtained was intimately reviewed by me.  Interpretation: Moderate osteoarthritis.  No acute fracture.  Patient is currently out of her Celebrex.  Restarting.  Rx sent to the pharmacy.  Advise rest, ice, elevation.  Supportive care.  Final Clinical Impressions(s) / UC Diagnoses   Final diagnoses:  Acute pain of left knee  Primary osteoarthritis of left knee     Discharge Instructions     Rest, ice, elevation.  Medication as prescribed.  Take care  Dr. Lacinda Axon    ED Prescriptions    Medication Sig Dispense Auth. Provider   celecoxib (CELEBREX) 200 MG capsule Take 1 capsule (200 mg total) by mouth 2 (two) times daily. 60 capsule Thersa Salt G, DO     PDMP not reviewed this encounter.   Coral Spikes, [place] 03/30/21 1705

## 2021-03-30 NOTE — ED Triage Notes (Signed)
Patient complains of left knee pain after falling while slipping on cardboard on Tuesday.

## 2021-03-30 NOTE — Discharge Instructions (Signed)
Rest, ice, elevation.  Medication as prescribed.  Take care  Dr. Lacinda Axon

## 2021-04-20 ENCOUNTER — Ambulatory Visit
Admission: RE | Admit: 2021-04-20 | Discharge: 2021-04-20 | Disposition: A | Discharge status: Home / Self Care | Payer: Medicare Other | Source: Ambulatory Visit | Attending: Physician Assistant | Admitting: Physician Assistant

## 2021-04-20 ENCOUNTER — Other Ambulatory Visit: Payer: Self-pay

## 2021-04-20 VITALS — BP 141/88 | HR 74 | Temp 98.8°F | Resp 18 | Ht 64.0 in | Wt 250.0 lb

## 2021-04-20 DIAGNOSIS — J069 Acute upper respiratory infection, unspecified: Secondary | ICD-10-CM

## 2021-04-20 DIAGNOSIS — R059 Cough, unspecified: Secondary | ICD-10-CM | POA: Insufficient documentation

## 2021-04-20 DIAGNOSIS — Z20822 Contact with and (suspected) exposure to covid-19: Secondary | ICD-10-CM | POA: Insufficient documentation

## 2021-04-20 MED ORDER — BENZONATATE 200 MG PO CAPS: 200.0000 mg | ORAL_CAPSULE | Freq: Three times a day (TID) | ORAL | 0 refills | Status: AC | PRN

## 2021-04-20 NOTE — ED Provider Notes (Signed)
[facility]    CSN: [id number] Arrival date & time: 04/20/21  0954      History   Chief Complaint Chief Complaint  Patient presents with  . Cough    HPI Katelyn Harrison is a 60 y.o. female presenting for 3-week history of dry cough.  She denies fever, fatigue, congestion, sore throat, headaches, body aches, chest pain, breathing difficulty, nausea/vomiting.  Patient states that she had diarrhea for a couple of days but that has resolved.  She says that a young relative has had a cough but she denies any known exposure to COVID-19 or influenza.  Patient has taken over-the-counter Mucinex since yesterday but says she has not noticed it helping the cough.  She denies any personal history of asthma, COPD and does not smoke.  She has no other complaints or concerns today.  HPI  Past Medical History:  Diagnosis Date  . Cancer ([place]) 2014   lymphoma- had chemo and rad  . Hypertension   . Neuropathy   . Personal history of chemotherapy   . Personal history of radiation therapy     There are no problems to display for this patient.   Past Surgical History:  Procedure Laterality Date  . ANKLE FRACTURE SURGERY Right 06/06/2018  . KNEE ARTHROSCOPY     both knees  . KNEE SURGERY Bilateral    20+ years ago    OB History   No obstetric history on file.      Home Medications    Prior to Admission medications   Medication Sig Start Date End Date Taking? Authorizing Provider  amLODipine (NORVASC) 5 MG tablet Take 5 mg by mouth daily.   Yes [provider]  benzonatate (TESSALON) 200 MG capsule Take 1 capsule (200 mg total) by mouth 3 (three) times daily as needed for up to 7 days for cough. 04/20/21 04/27/21 Yes Laurene Footman B, PA-C  celecoxib (CELEBREX) 200 MG capsule Take 1 capsule (200 mg total) by mouth 2 (two) times daily. 03/30/21  Yes Cook, Jayce G, DO  Doxepin HCl 3 MG TABS Take 1 tablet by mouth at bedtime. 03/28/21  Yes [provider]   gabapentin (NEURONTIN) 300 MG capsule Take 300 mg by mouth at bedtime.   Yes [provider]  HYDROcodone-acetaminophen (NORCO/VICODIN) 5-325 MG tablet 1-2 tabs po q 8 hours prn 07/04/17  Yes Conty, [place], MD  magnesium oxide (MAG-OX) 400 MG tablet Take 400 mg by mouth daily.   Yes [provider]    Family History Family History  Problem Relation Age of Onset  . Cancer Mother   . Breast cancer Mother 77  . Hypertension Father   . Diabetes Father     Social History Social History   Tobacco Use  . Smoking status: Never Smoker  . Smokeless tobacco: Never Used  Vaping Use  . Vaping Use: Never used  Substance Use Topics  . Alcohol use: No  . Drug use: No     Allergies   Shellfish allergy and Compazine [prochlorperazine edisylate]   Review of Systems Review of Systems  Constitutional: Negative for chills, diaphoresis, fatigue and fever.  HENT: Negative for congestion, ear pain, rhinorrhea, sinus pressure, sinus pain and sore throat.   Respiratory: Positive for cough. Negative for shortness of breath.   Gastrointestinal: Negative for abdominal pain, nausea and vomiting.  Musculoskeletal: Negative for arthralgias and myalgias.  Skin: Negative for rash.  Neurological: Negative for weakness and headaches.  Hematological: Negative for adenopathy.  Physical Exam Triage Vital Signs ED Triage Vitals  Enc Vitals Group     BP 04/20/21 1015 (!) 141/88     Pulse Rate 04/20/21 1015 74     Resp 04/20/21 1015 18     Temp 04/20/21 1015 98.8 F (37.1 C)     Temp Source 04/20/21 1015 Oral     SpO2 04/20/21 1015 98 %     Weight 04/20/21 1016 250 lb (113.4 kg)     Height 04/20/21 1016 5\' 4"  (1.626 m)     Head Circumference --      Peak Flow --      Pain Score 04/20/21 1016 0     Pain Loc --      Pain Edu? --      Excl. in [place]? --    No data found.  Updated Vital Signs BP (!) 141/88 (BP Location: Left Arm)   Pulse 74   Temp 98.8 F (37.1 C) (Oral)    Resp 18   Ht 5\' 4"  (1.626 m)   Wt 250 lb (113.4 kg)   SpO2 98%   BMI 42.91 kg/m       Physical Exam Vitals and nursing note reviewed.  Constitutional:      General: She is not in acute distress.    Appearance: Normal appearance. She is not ill-appearing or toxic-appearing.  HENT:     Head: Normocephalic and atraumatic.     Nose: Nose normal.     Mouth/Throat:     Mouth: Mucous membranes are moist.     Pharynx: Oropharynx is clear.  Eyes:     General: No scleral icterus.       Right eye: No discharge.        Left eye: No discharge.     Conjunctiva/sclera: Conjunctivae normal.  Cardiovascular:     Rate and Rhythm: Normal rate and regular rhythm.     Heart sounds: Normal heart sounds.  Pulmonary:     Effort: Pulmonary effort is normal. No respiratory distress.     Breath sounds: Normal breath sounds.  Musculoskeletal:     Cervical back: Neck supple.  Skin:    General: Skin is dry.  Neurological:     General: No focal deficit present.     Mental Status: She is alert. Mental status is at baseline.     Motor: No weakness.     Gait: Gait normal.  Psychiatric:        Mood and Affect: Mood normal.        Behavior: Behavior normal.        Thought Content: Thought content normal.      UC Treatments / Results  Labs (all labs ordered are listed, but only abnormal results are displayed) Labs Reviewed  SARS CORONAVIRUS 2 (TAT 6-24 HRS)    EKG   Radiology No results found.  Procedures Procedures (including critical care time)  Medications Ordered in UC Medications - No data to display  Initial Impression / Assessment and Plan / UC Course  I have reviewed the triage vital signs and the nursing notes.  Pertinent labs & imaging results that were available during my care of the patient were reviewed by me and considered in my medical decision making (see chart for details).   60 year old female presenting for cough x3 days.  Patient denying any other symptoms or  complaints.  Vital signs are stable.  Blood pressure slightly elevated 141/88.  Patient is overall well-appearing.  She denies any  shortness of breath or chest discomfort.  Exam significant for well-appearing female.  Her chest is clear to auscultation heart regular rate and rhythm.  COVID-19 test performed.  Current CDC guidelines, isolation protocol and ED precautions reviewed with patient.  Advised this is likely a viral illness and supportive care encouraged.  Advised she can try the benzonatate to help with her cough.  And then increase rest and fluids.  Advised to follow-up with our department as needed for any worsening symptoms, especially she develops a fever, worsening cough or breathing difficulty.  Patient agreeable to plan.   Final Clinical Impressions(s) / UC Diagnoses   Final diagnoses:  Viral URI with cough     Discharge Instructions     URI/COLD SYMPTOMS: Your exam today is consistent with a viral illness. Antibiotics are not indicated at this time. Use medications as directed, including cough syrup, nasal saline, and decongestants. Your symptoms should improve over the next few days and resolve within 7-10 days. Increase rest and fluids. F/u if symptoms worsen or predominate such as sore throat, ear pain, productive cough, shortness of breath, or if you develop high fevers or worsening fatigue over the next several days.    You have received COVID testing today either for positive exposure, concerning symptoms that could be related to COVID infection, screening purposes, or re-testing after confirmed positive.  Your test obtained today checks for active viral infection in the last 1-2 weeks. If your test is negative now, you can still test positive later. So, if you do develop symptoms you should either get re-tested and/or isolate x 5 days and then strict mask use x 5 days (unvaccinated) or mask use x 10 days (vaccinated). Please follow CDC guidelines.  While Rapid antigen  tests come back in 15-20 minutes, send out PCR/molecular test results typically come back within 1-3 days. In the mean time, if you are symptomatic, assume this could be a positive test and treat/monitor yourself as if you do have COVID.   We will call with test results if positive. Please download the MyChart app and set up a profile to access test results.   If symptomatic, go home and rest. Push fluids. Take Tylenol as needed for discomfort. Gargle warm salt water. Throat lozenges. Take Mucinex DM or Robitussin for cough. Humidifier in bedroom to ease coughing. Warm showers. Also review the COVID handout for more information.  COVID-19 INFECTION: The incubation period of COVID-19 is approximately 14 days after exposure, with most symptoms developing in roughly 4-5 days. Symptoms may range in severity from mild to critically severe. Roughly 80% of those infected will have mild symptoms. People of any age may become infected with COVID-19 and have the ability to transmit the virus. The most common symptoms include: fever, fatigue, cough, body aches, headaches, sore throat, nasal congestion, shortness of breath, nausea, vomiting, diarrhea, changes in smell and/or taste.    COURSE OF ILLNESS Some patients may begin with mild disease which can progress quickly into critical symptoms. If your symptoms are worsening please call ahead to the Emergency Department and proceed there for further treatment. Recovery time appears to be roughly 1-2 weeks for mild symptoms and 3-6 weeks for severe disease.   GO IMMEDIATELY TO ER FOR FEVER YOU ARE UNABLE TO GET DOWN WITH TYLENOL, BREATHING PROBLEMS, CHEST PAIN, FATIGUE, LETHARGY, INABILITY TO EAT OR DRINK, ETC  QUARANTINE AND ISOLATION: To help decrease the spread of COVID-19 please remain isolated if you have COVID infection or are highly  suspected to have COVID infection. This means -stay home and isolate to one room in the home if you live with others. Do not share  a bed or bathroom with others while ill, sanitize and wipe down all countertops and keep common areas clean and disinfected. Stay home for 5 days. If you have no symptoms or your symptoms are resolving after 5 days, you can leave your house. Continue to wear a mask around others for 5 additional days. If you have been in close contact (within 6 feet) of someone diagnosed with COVID 19, you are advised to quarantine in your home for 14 days as symptoms can develop anywhere from 2-14 days after exposure to the virus. If you develop symptoms, you  must isolate.  Most current guidelines for COVID after exposure -unvaccinated: isolate 5 days and strict mask use x 5 days. Test on day 5 is possible -vaccinated: wear mask x 10 days if symptoms do not develop -You do not necessarily need to be tested for COVID if you have + exposure and  develop symptoms. Just isolate at home x10 days from symptom onset During this global pandemic, CDC advises to practice social distancing, try to stay at least 68ft away from others at all times. Wear a face covering. Wash and sanitize your hands regularly and avoid going anywhere that is not necessary.  KEEP IN MIND THAT THE COVID TEST IS NOT 100% ACCURATE AND YOU SHOULD STILL DO EVERYTHING TO PREVENT POTENTIAL SPREAD OF VIRUS TO OTHERS (WEAR MASK, WEAR GLOVES, War HANDS AND SANITIZE REGULARLY). IF INITIAL TEST IS NEGATIVE, THIS MAY NOT MEAN YOU ARE DEFINITELY NEGATIVE. MOST ACCURATE TESTING IS DONE 5-7 DAYS AFTER EXPOSURE.   It is not advised by CDC to get re-tested after receiving a positive COVID test since you can still test positive for weeks to months after you have already cleared the virus.   *If you have not been vaccinated for COVID, I strongly suggest you consider getting vaccinated as long as there are no contraindications.      ED Prescriptions    Medication Sig Dispense Auth. Provider   benzonatate (TESSALON) 200 MG capsule Take 1 capsule (200 mg total) by  mouth 3 (three) times daily as needed for up to 7 days for cough. 20 capsule Danton Clap, PA-C     PDMP not reviewed this encounter.   Danton Clap, PA-C 04/20/21 1109

## 2021-04-20 NOTE — ED Triage Notes (Signed)
Patient c/o cough that started on Monday. Denies fever. Denies any other symptoms.

## 2021-04-20 NOTE — Discharge Instructions (Signed)

## 2021-04-21 LAB — SARS CORONAVIRUS 2 (TAT 6-24 HRS): SARS Coronavirus 2: NEGATIVE

## 2021-07-23 ENCOUNTER — Other Ambulatory Visit: Payer: Self-pay

## 2021-07-23 ENCOUNTER — Emergency Department
Admission: EM | Admit: 2021-07-23 | Discharge: 2021-07-23 | Disposition: A | Discharge status: Home / Self Care | Payer: Medicare Other | Attending: Emergency Medicine | Admitting: Emergency Medicine

## 2021-07-23 ENCOUNTER — Encounter: Payer: Self-pay | Admitting: Emergency Medicine

## 2021-07-23 DIAGNOSIS — R110 Nausea: Secondary | ICD-10-CM | POA: Diagnosis not present

## 2021-07-23 DIAGNOSIS — R42 Dizziness and giddiness: Secondary | ICD-10-CM | POA: Diagnosis present

## 2021-07-23 DIAGNOSIS — Z5321 Procedure and treatment not carried out due to patient leaving prior to being seen by health care provider: Secondary | ICD-10-CM | POA: Diagnosis not present

## 2021-07-23 LAB — URINALYSIS, COMPLETE (UACMP) WITH MICROSCOPIC
Bilirubin Urine: NEGATIVE
Glucose, UA: NEGATIVE mg/dL
Ketones, ur: 5 mg/dL — AB
Leukocytes,Ua: NEGATIVE
Nitrite: NEGATIVE
Protein, ur: 30 mg/dL — AB
Specific Gravity, Urine: 1.02 (ref 1.005–1.030)
pH: 6 (ref 5.0–8.0)

## 2021-07-23 LAB — BASIC METABOLIC PANEL
Anion gap: 10 (ref 5–15)
BUN: 13 mg/dL (ref 6–20)
CO2: 24 mmol/L (ref 22–32)
Calcium: 9.1 mg/dL (ref 8.9–10.3)
Chloride: 102 mmol/L (ref 98–111)
Creatinine, Ser: 1.14 mg/dL — ABNORMAL HIGH (ref 0.44–1.00)
GFR, Estimated: 56 mL/min — ABNORMAL LOW (ref >60)
Glucose, Bld: 134 mg/dL — ABNORMAL HIGH (ref 70–99)
Potassium: 3.4 mmol/L — ABNORMAL LOW (ref 3.5–5.1)
Sodium: 136 mmol/L (ref 135–145)

## 2021-07-23 LAB — CBC
HCT: 44.7 % (ref 36.0–46.0)
Hemoglobin: 14.3 g/dL (ref 12.0–15.0)
MCH: 27.9 pg (ref 26.0–34.0)
MCHC: 32 g/dL (ref 30.0–36.0)
MCV: 87.1 fL (ref 80.0–100.0)
Platelets: 202 10*3/uL (ref 150–400)
RBC: 5.13 MIL/uL — ABNORMAL HIGH (ref 3.87–5.11)
RDW: 14.7 % (ref 11.5–15.5)
WBC: 6.5 10*3/uL (ref 4.0–10.5)
nRBC: 0 % (ref 0.0–0.2)

## 2021-07-23 MED ORDER — ONDANSETRON 4 MG PO TBDP
4.0000 mg | ORAL_TABLET | Freq: Once | ORAL | Status: DC
  Filled 2021-07-23: qty 1

## 2021-07-23 NOTE — ED Triage Notes (Signed)
Pt in via EMS from home with feeling dizzy since 11:30 and nauseated. Pt took zofran at home.

## 2021-07-23 NOTE — ED Notes (Signed)
Pt called again for vital signs and update, no answer, pt not visualized in lobby by staff.

## 2021-07-23 NOTE — ED Notes (Signed)
Called pt x2 for repeat vital signs without response.

## 2021-07-23 NOTE — ED Notes (Signed)
No answer when called for repeat vitals and update on wait.

## 2021-07-23 NOTE — ED Triage Notes (Signed)
Pt reports was at church this am and started feeling dizzy while sitting. Pt reports when she stood the dizziness got worse and she has felt this all day. Pt also reports some nausea. Pt reports is starting to feel better now.

## 2021-11-05 ENCOUNTER — Other Ambulatory Visit: Payer: Self-pay | Admitting: Family Medicine

## 2022-09-10 ENCOUNTER — Emergency Department
Admission: EM | Admit: 2022-09-10 | Discharge: 2022-09-10 | Disposition: A | Discharge status: Home / Self Care | Payer: Medicare HMO | Attending: Emergency Medicine | Admitting: Emergency Medicine

## 2022-09-10 ENCOUNTER — Emergency Department: Payer: Medicare HMO

## 2022-09-10 DIAGNOSIS — I10 Essential (primary) hypertension: Secondary | ICD-10-CM | POA: Diagnosis not present

## 2022-09-10 DIAGNOSIS — R110 Nausea: Secondary | ICD-10-CM | POA: Diagnosis not present

## 2022-09-10 DIAGNOSIS — R42 Dizziness and giddiness: Secondary | ICD-10-CM | POA: Insufficient documentation

## 2022-09-10 LAB — CBC
HCT: 42.1 % (ref 36.0–46.0)
Hemoglobin: 13.2 g/dL (ref 12.0–15.0)
MCH: 26.7 pg (ref 26.0–34.0)
MCHC: 31.4 g/dL (ref 30.0–36.0)
MCV: 85.2 fL (ref 80.0–100.0)
Platelets: 203 10*3/uL (ref 150–400)
RBC: 4.94 MIL/uL (ref 3.87–5.11)
RDW: 14.6 % (ref 11.5–15.5)
WBC: 8.1 10*3/uL (ref 4.0–10.5)
nRBC: 0 % (ref 0.0–0.2)

## 2022-09-10 LAB — COMPREHENSIVE METABOLIC PANEL
ALT: 15 U/L (ref 0–44)
AST: 22 U/L (ref 15–41)
Albumin: 4 g/dL (ref 3.5–5.0)
Alkaline Phosphatase: 61 U/L (ref 38–126)
Anion gap: 9 (ref 5–15)
BUN: 14 mg/dL (ref 6–20)
CO2: 23 mmol/L (ref 22–32)
Calcium: 8.9 mg/dL (ref 8.9–10.3)
Chloride: 109 mmol/L (ref 98–111)
Creatinine, Ser: 1.08 mg/dL — ABNORMAL HIGH (ref 0.44–1.00)
GFR, Estimated: 59 mL/min — ABNORMAL LOW (ref >60)
Glucose, Bld: 145 mg/dL — ABNORMAL HIGH (ref 70–99)
Potassium: 3.6 mmol/L (ref 3.5–5.1)
Sodium: 141 mmol/L (ref 135–145)
Total Bilirubin: 0.9 mg/dL (ref 0.3–1.2)
Total Protein: 7.6 g/dL (ref 6.5–8.1)

## 2022-09-10 LAB — URINALYSIS, ROUTINE W REFLEX MICROSCOPIC
Bilirubin Urine: NEGATIVE
Glucose, UA: NEGATIVE mg/dL
Ketones, ur: 20 mg/dL — AB
Leukocytes,Ua: NEGATIVE
Nitrite: NEGATIVE
Protein, ur: 30 mg/dL — AB
Specific Gravity, Urine: 1.017 (ref 1.005–1.030)
pH: 6 (ref 5.0–8.0)

## 2022-09-10 LAB — LIPASE, BLOOD: Lipase: 25 U/L (ref 11–51)

## 2022-09-10 LAB — POC URINE PREG, ED: Preg Test, Ur: NEGATIVE

## 2022-09-10 MED ORDER — MECLIZINE HCL 25 MG PO TABS: 25.0000 mg | ORAL_TABLET | Freq: Three times a day (TID) | ORAL | 0 refills | Status: AC | PRN

## 2022-09-10 MED ORDER — ONDANSETRON HCL 4 MG/2ML IJ SOLN
4.0000 mg | Freq: Once | INTRAMUSCULAR | Status: AC
  Administered 2022-09-10: 4 mg via INTRAVENOUS
  Filled 2022-09-10: qty 2

## 2022-09-10 MED ORDER — ONDANSETRON 4 MG PO TBDP: 4.0000 mg | ORAL_TABLET | Freq: Three times a day (TID) | ORAL | 0 refills | Status: AC | PRN

## 2022-09-10 MED ORDER — SODIUM CHLORIDE 0.9 % IV BOLUS
500.0000 mL | Freq: Once | INTRAVENOUS | Status: AC
  Administered 2022-09-10: 500 mL via INTRAVENOUS

## 2022-09-10 MED ORDER — MECLIZINE HCL 25 MG PO TABS
50.0000 mg | ORAL_TABLET | Freq: Once | ORAL | Status: AC
  Administered 2022-09-10: 50 mg via ORAL
  Filled 2022-09-10: qty 2

## 2022-09-10 NOTE — ED Triage Notes (Signed)
1 hour prior pt became dizzy and nauseated

## 2022-09-10 NOTE — ED Provider Notes (Signed)
[facility] Provider Note    Event Date/Time   First MD Initiated Contact with Patient 09/10/22 1333     (approximate)   History   Chief Complaint: Dizziness (Nausea, dizziness, started 1 hour ago, came by ems )   HPI  Katelyn Harrison is a 61 y.o. female with a history of hypertension, neuropathy, vertigo who comes the ED complaining of lightheadedness that started while walking out of a grocery store today.  Associate with nausea.  She made it to her car and sat down, but felt very dizzy.  No chest pain shortness of breath headache or belly pain.  She denies passing out.  No recent illness or injury.  Reports that she has had vertigo in the past which gets better with Zofran and meclizine.     Physical Exam   Triage Vital Signs: ED Triage Vitals  Enc Vitals Group     BP 09/10/22 1250 (!) 133/96     Pulse Rate 09/10/22 1250 84     Resp 09/10/22 1250 17     Temp 09/10/22 1250 98.4 F (36.9 C)     Temp Source 09/10/22 1250 Oral     SpO2 09/10/22 1250 98 %     Weight 09/10/22 1251 297 lb 9.9 oz (135 kg)     Height 09/10/22 1251 '5\' 5"'$  (1.651 m)     Head Circumference --      Peak Flow --      Pain Score --      Pain Loc --      Pain Edu? --      Excl. in [place]? --     Most recent vital signs: Vitals:   09/10/22 1250  BP: (!) 133/96  Pulse: 84  Resp: 17  Temp: 98.4 F (36.9 C)  SpO2: 98%    General: Awake, no distress.  CV:  Good peripheral perfusion.  Regular rate and rhythm Resp:  Normal effort.  Clear to auscultation bilaterally Abd:  No distention.  Soft and nontender Other:  PERRL, EOMI, neuro exam unremarkable   ED Results / Procedures / Treatments   Labs (all labs ordered are listed, but only abnormal results are displayed) Labs Reviewed  COMPREHENSIVE METABOLIC PANEL - Abnormal; Notable for the following components:      Result Value   Glucose, Bld 145 (*)    Creatinine, Ser 1.08 (*)    GFR, Estimated 59 (*)    All other  components within normal limits  LIPASE, BLOOD  CBC  URINALYSIS, ROUTINE W REFLEX MICROSCOPIC  POC URINE PREG, ED     EKG Interpreted by me Sinus rhythm rate of 74.  Normal axis, normal intervals.  Normal QRS ST segments and T waves.  No ischemic changes.   RADIOLOGY CT head interpreted by me, negative for intracranial mass or hemorrhage.  Radiology report reviewed.  MRI brain pending   PROCEDURES:  Procedures   MEDICATIONS ORDERED IN ED: Medications  meclizine (ANTIVERT) tablet 50 mg (50 mg Oral Given 09/10/22 1338)  sodium chloride 0.9 % bolus 500 mL (500 mLs Intravenous New Bag/Given 09/10/22 1352)  ondansetron (ZOFRAN) injection 4 mg (4 mg Intravenous Given 09/10/22 1350)     IMPRESSION / MDM / ASSESSMENT AND PLAN / ED COURSE  I reviewed the triage vital signs and the nursing notes.  Differential diagnosis includes, but is not limited to, vertigo, dehydration, orthostatic hypotension, atypical migraine, cerebellar stroke  Patient's presentation is most consistent with acute presentation with potential threat to life or bodily function.  Patient presents with lightheadedness and dizziness, acute onset that has remained constant.  History not consistent with vertigo.  Patient given meclizine, and subsequently vomited.  Vital signs unremarkable, exam is nonfocal.  CT head and serum labs are unremarkable.  Will obtain MRI to further evaluate for possible stroke while giving IV fluids and Zofran for supportive care.  Care signed out to Dr. Rip Harbour at 3:00 PM       FINAL CLINICAL IMPRESSION(S) / ED DIAGNOSES   Final diagnoses:  Dizziness     Rx / DC Orders   ED Discharge Orders     None        Note:  This document was prepared using Dragon voice recognition software and may include unintentional dictation errors.   Carrie Mew, MD 09/10/22 [phone]

## 2022-09-10 NOTE — ED Notes (Signed)
Assumed care. Pt after laying in bed feels like she can ambulate. EKG done on pt. Pt states she has a previous hx of vertigo and it feels similar to that in the past.

## 2022-09-10 NOTE — ED Provider Notes (Signed)
MRI has returned showing no acute problems.  Patient reports she feels well.  She does ask for some meclizine and Zofran in case she has any trouble and I will give her this.   Nena Polio, MD 09/10/22 Pauline Aus

## 2022-09-10 NOTE — ED Triage Notes (Signed)
Pt via EMS from home. Pt states she was sitting in her car with a sudden onset of dizziness, lightheadedness, and nausea. Pt has a hx of vertigo. Pt takes Zofran 128/94 96%  81 HR  16 RR  20G L AC with 4 of Zofran

## 2022-09-10 NOTE — Discharge Instructions (Addendum)
Please return for any further problems.  Please follow-up with your regular doctor.  I have given you some more meclizine and Zofran if you need them at home.  Please remember though if anything gets any different than its been in the past do not hesitate to come back.

## 2022-09-10 NOTE — ED Notes (Signed)
Pt reports relief from dizziness at this time, no complaints of pain.

## 2023-04-30 ENCOUNTER — Other Ambulatory Visit: Payer: Self-pay | Admitting: Internal Medicine

## 2023-04-30 DIAGNOSIS — Z1231 Encounter for screening mammogram for malignant neoplasm of breast: Secondary | ICD-10-CM

## 2023-05-06 ENCOUNTER — Inpatient Hospital Stay: Admission: RE | Admit: 2023-05-06 | Payer: Medicare HMO | Source: Ambulatory Visit

## 2023-05-29 ENCOUNTER — Ambulatory Visit
Admission: RE | Admit: 2023-05-29 | Discharge: 2023-05-29 | Disposition: A | Discharge status: Home / Self Care | Payer: Medicare HMO | Source: Ambulatory Visit | Attending: Internal Medicine | Admitting: Internal Medicine

## 2023-05-29 ENCOUNTER — Ambulatory Visit: Payer: Medicare HMO

## 2023-05-29 DIAGNOSIS — Z1231 Encounter for screening mammogram for malignant neoplasm of breast: Secondary | ICD-10-CM | POA: Diagnosis present

## 2023-05-31 ENCOUNTER — Other Ambulatory Visit: Payer: Self-pay | Admitting: *Deleted

## 2023-05-31 ENCOUNTER — Inpatient Hospital Stay
Admission: RE | Admit: 2023-05-31 | Discharge: 2023-05-31 | Disposition: A | Discharge status: Home / Self Care | Payer: Self-pay | Source: Ambulatory Visit | Attending: Family Medicine | Admitting: Family Medicine

## 2023-05-31 DIAGNOSIS — Z1231 Encounter for screening mammogram for malignant neoplasm of breast: Secondary | ICD-10-CM
